# Patient Record
Sex: Female | Born: 1960 | Race: Black or African American | Hispanic: No | Marital: Single | Smoking: Former smoker
Health system: Southern US, Community
[De-identification: ages and names within clinical notes are randomized; demographics above are authoritative.]

## PROBLEM LIST (undated history)

## (undated) DIAGNOSIS — G5601 Carpal tunnel syndrome, right upper limb: Secondary | ICD-10-CM

## (undated) DIAGNOSIS — E785 Hyperlipidemia, unspecified: Secondary | ICD-10-CM

## (undated) DIAGNOSIS — J45909 Unspecified asthma, uncomplicated: Secondary | ICD-10-CM

## (undated) DIAGNOSIS — I10 Essential (primary) hypertension: Secondary | ICD-10-CM

## (undated) DIAGNOSIS — M5412 Radiculopathy, cervical region: Secondary | ICD-10-CM

## (undated) DIAGNOSIS — F329 Major depressive disorder, single episode, unspecified: Secondary | ICD-10-CM

## (undated) DIAGNOSIS — J432 Centrilobular emphysema: Secondary | ICD-10-CM

## (undated) DIAGNOSIS — R7303 Prediabetes: Secondary | ICD-10-CM

## (undated) DIAGNOSIS — J449 Chronic obstructive pulmonary disease, unspecified: Secondary | ICD-10-CM

## (undated) DIAGNOSIS — I700 Atherosclerosis of aorta: Secondary | ICD-10-CM

## (undated) DIAGNOSIS — R911 Solitary pulmonary nodule: Secondary | ICD-10-CM

## (undated) DIAGNOSIS — R635 Abnormal weight gain: Secondary | ICD-10-CM

## (undated) HISTORY — PX: CARPAL TUNNEL RELEASE: SHX101

## (undated) HISTORY — DX: Abnormal weight gain: R63.5

## (undated) HISTORY — DX: Carpal tunnel syndrome, right upper limb: G56.01

## (undated) HISTORY — DX: Radiculopathy, cervical region: M54.12

## (undated) HISTORY — DX: Essential (primary) hypertension: I10

## (undated) HISTORY — PX: TUBAL LIGATION: SHX77

## (undated) HISTORY — PX: OOPHORECTOMY: SHX86

## (undated) HISTORY — DX: Unspecified asthma, uncomplicated: J45.909

## (undated) HISTORY — PX: BREAST BIOPSY: SHX20

## (undated) HISTORY — DX: Hyperlipidemia, unspecified: E78.5

## (undated) HISTORY — DX: Prediabetes: R73.03

## (undated) HISTORY — PX: ABDOMINAL HYSTERECTOMY: SHX81

---
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
## 1997-08-23 HISTORY — PX: CERVICAL SPINE SURGERY: SHX589

## 2000-08-23 HISTORY — PX: CERVICAL FUSION: SHX112

## 2010-10-10 DIAGNOSIS — J449 Chronic obstructive pulmonary disease, unspecified: Secondary | ICD-10-CM

## 2010-10-10 HISTORY — DX: Chronic obstructive pulmonary disease, unspecified: J44.9

## 2011-05-03 DIAGNOSIS — F32A Depression, unspecified: Secondary | ICD-10-CM

## 2011-05-03 DIAGNOSIS — M545 Low back pain, unspecified: Secondary | ICD-10-CM | POA: Insufficient documentation

## 2011-05-03 DIAGNOSIS — E669 Obesity, unspecified: Secondary | ICD-10-CM | POA: Insufficient documentation

## 2011-05-03 DIAGNOSIS — F329 Major depressive disorder, single episode, unspecified: Secondary | ICD-10-CM | POA: Insufficient documentation

## 2011-05-03 DIAGNOSIS — E785 Hyperlipidemia, unspecified: Secondary | ICD-10-CM | POA: Insufficient documentation

## 2011-05-03 HISTORY — DX: Depression, unspecified: F32.A

## 2015-12-29 DIAGNOSIS — I83819 Varicose veins of unspecified lower extremities with pain: Secondary | ICD-10-CM | POA: Insufficient documentation

## 2018-06-02 DIAGNOSIS — Z23 Encounter for immunization: Secondary | ICD-10-CM | POA: Diagnosis not present

## 2019-01-03 ENCOUNTER — Other Ambulatory Visit: Payer: Self-pay

## 2019-01-03 ENCOUNTER — Encounter: Payer: Self-pay | Admitting: Sports Medicine

## 2019-01-03 ENCOUNTER — Ambulatory Visit (INDEPENDENT_AMBULATORY_CARE_PROVIDER_SITE_OTHER): Payer: Managed Care, Other (non HMO) | Admitting: Sports Medicine

## 2019-01-03 ENCOUNTER — Ambulatory Visit: Payer: Managed Care, Other (non HMO)

## 2019-01-03 DIAGNOSIS — M722 Plantar fascial fibromatosis: Secondary | ICD-10-CM

## 2019-01-03 DIAGNOSIS — I82812 Embolism and thrombosis of superficial veins of left lower extremities: Secondary | ICD-10-CM

## 2019-01-03 DIAGNOSIS — I872 Venous insufficiency (chronic) (peripheral): Secondary | ICD-10-CM

## 2019-01-03 DIAGNOSIS — D219 Benign neoplasm of connective and other soft tissue, unspecified: Secondary | ICD-10-CM | POA: Insufficient documentation

## 2019-01-03 DIAGNOSIS — I10 Essential (primary) hypertension: Secondary | ICD-10-CM | POA: Diagnosis not present

## 2019-01-03 MED ORDER — LISINOPRIL-HYDROCHLOROTHIAZIDE 20-25 MG PO TABS: 1.0000 | ORAL_TABLET | Freq: Every day | ORAL | 3 refills | Status: DC

## 2019-01-03 MED ORDER — MELOXICAM 15 MG PO TABS: ORAL_TABLET | ORAL | 3 refills | Status: DC

## 2019-01-03 NOTE — Assessment & Plan Note (Signed)
Needs to be consistent with bilateral lower extremity compression hose. When she returns we may add an Haematologist. 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

## 2019-01-03 NOTE — Assessment & Plan Note (Signed)
Referral for custom orthotics, avoid barefoot walking, meloxicam. Referral to physical therapy for rehab exercises. Return to see me in 1 month for this.

## 2019-01-03 NOTE — Progress Notes (Addendum)
Subjective:    CC: Multiple issues  HPI:  Pain: Localized on the plantar aspect of the heel, worse in the morning, worse when first getting up from seated.  She does tend to walk barefoot significantly and wear unsupportive shoes.  Pain is severe, persistent, localized without radiation.  Leg nodules: There is a tender nodule just below the knee on the left leg, she has varicose veins on both sides as well as a reddish ulcer on the right anterior shin.  Intermittent with wearing her lower extremity compression stockings.  Hypertension: Blood pressure is very elevated, over 200, no headache, vision changes, chest pain.  She does desire to establish care with 1 of our medical providers here in the office.  I reviewed the past medical history, family history, social history, surgical history, and allergies today and no changes were needed.  Please see the problem list section below in epic for further details.  Past Medical History: Past Medical History:  Diagnosis Date  . Asthma   . COPD (chronic obstructive pulmonary disease) ([place]) 10/10/2010  . Hypertension   . Weight gain    Past Surgical History: History reviewed. No pertinent surgical history. Social History: Social History   Socioeconomic History  . Marital status: Single    Spouse name: Not on file  . Number of children: 2  . Years of education: Not on file  . Highest education level: High school graduate  Occupational History  . Not on file  Social Needs  . Financial resource strain: Patient refused  . Food insecurity:    Worry: Not on file    Inability: Not on file  . Transportation needs:    Medical: Not on file    Non-medical: Not on file  Tobacco Use  . Smoking status: Former Smoker    Last attempt to quit: 01/02/2013    Years since quitting: 6.0  . Smokeless tobacco: Never Used  Substance and Sexual Activity  . Alcohol use: Never    Frequency: Never  . Drug use: Never  . Sexual activity: Not on file   Lifestyle  . Physical activity:    Days per week: Not on file    Minutes per session: Not on file  . Stress: Not on file  Relationships  . Social connections:    Talks on phone: Not on file    Gets together: Not on file    Attends religious service: Not on file    Active member of club or organization: Not on file    Attends meetings of clubs or organizations: Not on file    Relationship status: Not on file  Other Topics Concern  . Not on file  Social History Narrative  . Not on file   Family History: Family History  Problem Relation Age of Onset  . Cancer Neg Hx   . Birth defects Neg Hx   . Diabetes Neg Hx    Allergies: Allergies  Allergen Reactions  . Darvon [Propoxyphene] Other (See Comments)    Intolerance    Medications: See med rec.  Review of Systems: No headache, visual changes, nausea, vomiting, diarrhea, constipation, dizziness, abdominal pain, skin rash, fevers, chills, night sweats, swollen lymph nodes, weight loss, chest pain, body aches, joint swelling, muscle aches, shortness of breath, mood changes, visual or auditory hallucinations.  Objective:    General: Well Developed, well nourished, and in no acute distress.  Neuro: Alert and oriented x3, extra-ocular muscles intact, sensation grossly intact.  HEENT: Normocephalic, atraumatic, pupils  equal round reactive to light, neck supple, no masses, no lymphadenopathy, thyroid nonpalpable.  Skin: Warm and dry, no rashes noted.  Cardiac: Regular rate and rhythm, no murmurs rubs or gallops.  Respiratory: Clear to auscultation bilaterally. Not using accessory muscles, speaking in full sentences.  Abdominal: Soft, nontender, nondistended, positive bowel sounds, no masses, no organomegaly.  Right foot: No visible erythema or swelling. Range of motion is full in all directions. Strength is 5/5 in all directions. No hallux valgus. No pes cavus or pes planus. No abnormal callus noted. No pain over the navicular  prominence, or base of fifth metatarsal. Moderate tenderness to palpation of the calcaneal insertion of plantar fascia. No pain at the Achilles insertion. No pain over the calcaneal bursa. No pain of the retrocalcaneal bursa. No tenderness to palpation over the tarsals, metatarsals, or phalanges. No hallux rigidus or limitus. No tenderness palpation over interphalangeal joints. No pain with compression of the metatarsal heads. Neurovascularly intact distally. Right lower leg: Venous stasis ulcer over the shin, appears to be healing.  Moderate bilateral venous varicosities. Left lower leg: There is a palpable superficial venous thrombosis and what feels to be the great saphenous vein just distal to the knee.  The patient returned from ultrasound and I placed an Unna boot on her right lower extremity.  Impression and Recommendations:    The patient was counselled, risk factors were discussed, anticipatory guidance given.  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 She will massage this daily. She will wear bilateral compression hose every day all day.  Benign essential hypertension Blood pressure is severely elevated today. Starting lisinopril/HCTZ, adding labs, she will establish with Nelson Chimes, PA-C or Dr. Emeterio Reeve. No headaches, visual changes, chest pain.  Plantar fasciitis, right Referral for custom orthotics, avoid barefoot walking, meloxicam. Referral to physical therapy for rehab exercises. Return to see me in 1 month for this.  Venous stasis dermatitis of right lower extremity Needs to be consistent with bilateral lower extremity compression hose. When she returns we may add an Haematologist. 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   ___________________________________________ Gwen Her. Dianah Field, M.D., ABFM., CAQSM. Primary Care and Sports Medicine  [facility]  Adjunct  Professor of [place] of [facility] of Medicine

## 2019-01-03 NOTE — Assessment & Plan Note (Signed)
Meloxicam, ultrasound. She will massage this daily. She will wear bilateral compression hose every day all day.

## 2019-01-03 NOTE — Assessment & Plan Note (Signed)
Blood pressure is severely elevated today. Starting lisinopril/HCTZ, adding labs, she will establish with Nelson Chimes, PA-C or Dr. Emeterio Reeve. No headaches, visual changes, chest pain.

## 2019-01-04 LAB — COMPLETE METABOLIC PANEL WITH GFR
AG Ratio: 1.4 (calc) (ref 1.0–2.5)
ALT: 32 U/L — ABNORMAL HIGH (ref 6–29)
AST: 28 U/L (ref 10–35)
Albumin: 4.6 g/dL (ref 3.6–5.1)
Alkaline phosphatase (APISO): 96 U/L (ref 37–153)
BUN/Creatinine Ratio: 11 (calc) (ref 6–22)
BUN: 12 mg/dL (ref 7–25)
CO2: 30 mmol/L (ref 20–32)
Calcium: 9.8 mg/dL (ref 8.6–10.4)
Chloride: 102 mmol/L (ref 98–110)
Creat: 1.1 mg/dL — ABNORMAL HIGH (ref 0.50–1.05)
GFR, Est African American: 64 mL/min/{1.73_m2} (ref >=60)
GFR, Est Non African American: 55 mL/min/{1.73_m2} — ABNORMAL LOW (ref >=60)
Globulin: 3.2 g/dL (calc) (ref 1.9–3.7)
Glucose, Bld: 98 mg/dL (ref 65–99)
Potassium: 4 mmol/L (ref 3.5–5.3)
Sodium: 141 mmol/L (ref 135–146)
Total Bilirubin: 0.4 mg/dL (ref 0.2–1.2)
Total Protein: 7.8 g/dL (ref 6.1–8.1)

## 2019-01-04 LAB — LIPID PANEL W/REFLEX DIRECT LDL
Cholesterol: 289 mg/dL — ABNORMAL HIGH (ref <200)
HDL: 46 mg/dL — ABNORMAL LOW (ref >=50)
LDL Cholesterol (Calc): 197 mg/dL (calc) — ABNORMAL HIGH
Non-HDL Cholesterol (Calc): 243 mg/dL (calc) — ABNORMAL HIGH (ref <130)
Total CHOL/HDL Ratio: 6.3 (calc) — ABNORMAL HIGH (ref <5.0)
Triglycerides: 257 mg/dL — ABNORMAL HIGH (ref <150)

## 2019-01-04 LAB — CBC
HCT: 40.3 % (ref 35.0–45.0)
Hemoglobin: 13.4 g/dL (ref 11.7–15.5)
MCH: 29.8 pg (ref 27.0–33.0)
MCHC: 33.3 g/dL (ref 32.0–36.0)
MCV: 89.8 fL (ref 80.0–100.0)
MPV: 10.3 fL (ref 7.5–12.5)
Platelets: 292 10*3/uL (ref 140–400)
RBC: 4.49 10*6/uL (ref 3.80–5.10)
RDW: 13 % (ref 11.0–15.0)
WBC: 9.2 10*3/uL (ref 3.8–10.8)

## 2019-01-04 LAB — HEMOGLOBIN A1C
Hgb A1c MFr Bld: 6.2 % of total Hgb — ABNORMAL HIGH (ref <5.7)
Mean Plasma Glucose: 131 (calc)
eAG (mmol/L): 7.3 (calc)

## 2019-01-04 LAB — TSH: TSH: 2.88 mIU/L (ref 0.40–4.50)

## 2019-01-08 ENCOUNTER — Other Ambulatory Visit: Payer: Self-pay

## 2019-01-08 ENCOUNTER — Telehealth: Payer: Self-pay | Admitting: Sports Medicine

## 2019-01-08 ENCOUNTER — Encounter: Payer: Self-pay | Admitting: Rehabilitative and Restorative Service Providers"

## 2019-01-08 ENCOUNTER — Ambulatory Visit (INDEPENDENT_AMBULATORY_CARE_PROVIDER_SITE_OTHER): Payer: Managed Care, Other (non HMO) | Admitting: Sports Medicine

## 2019-01-08 ENCOUNTER — Encounter: Payer: Self-pay | Admitting: Sports Medicine

## 2019-01-08 ENCOUNTER — Ambulatory Visit (INDEPENDENT_AMBULATORY_CARE_PROVIDER_SITE_OTHER): Payer: Managed Care, Other (non HMO) | Admitting: Rehabilitative and Restorative Service Providers"

## 2019-01-08 DIAGNOSIS — I872 Venous insufficiency (chronic) (peripheral): Secondary | ICD-10-CM | POA: Diagnosis not present

## 2019-01-08 DIAGNOSIS — D367 Benign neoplasm of other specified sites: Secondary | ICD-10-CM | POA: Diagnosis not present

## 2019-01-08 DIAGNOSIS — R2689 Other abnormalities of gait and mobility: Secondary | ICD-10-CM

## 2019-01-08 DIAGNOSIS — I10 Essential (primary) hypertension: Secondary | ICD-10-CM

## 2019-01-08 DIAGNOSIS — M722 Plantar fascial fibromatosis: Secondary | ICD-10-CM | POA: Diagnosis not present

## 2019-01-08 DIAGNOSIS — D219 Benign neoplasm of connective and other soft tissue, unspecified: Secondary | ICD-10-CM

## 2019-01-08 DIAGNOSIS — M79671 Pain in right foot: Secondary | ICD-10-CM | POA: Diagnosis not present

## 2019-01-08 NOTE — Progress Notes (Signed)
   Procedure:  Excision of 1.1cm right leg subcutaneous tumor. Risks, benefits, and alternatives explained and consent obtained. Time out conducted. Surface prepped with alcohol. 5cc lidocaine with epinephine infiltrated in a field block. Adequate anesthesia ensured. Area prepped and draped in a sterile fashion. Excision performed with: Using a #10 blade I made a linear incision, I then used both sharp and blunt dissection to remove the lesion en bloc, it was a subcutaneous tumor approximately 1.1 cm across, well-defined, round, and very firm.  I then closed the incision with a running subcuticular 6-0 Vicryl suture. Hemostasis achieved. Pt stable.

## 2019-01-08 NOTE — Telephone Encounter (Signed)
I spoke with Annette Ayala at [facility] and she will fax the labs to our office so they can be reviewed. FYI

## 2019-01-08 NOTE — Assessment & Plan Note (Signed)
Repeat Unna boot, #2. Return in a week.

## 2019-01-08 NOTE — Assessment & Plan Note (Addendum)
Excision of 1.1 cm subcutaneous tumor. Primary closure, return in 2 weeks for wound check.  Path shows leiomyoma.

## 2019-01-08 NOTE — Progress Notes (Signed)
Subjective:    CC: Follow-up  HPI: Hypertension: Improved considerably, no headaches, visual changes, chest pain.  Plantar fasciitis: Improving slowly, does need to get the Unna boot off to do physical therapy eventually.  Venous stasis dermatitis: Improving with Unna boot on the right.  Subcutaneous lesion: Below the left knee, ultrasound showed a cystic structure, no evidence that this was vascular, she would like this to be removed, it is tender.  I reviewed the past medical history, family history, social history, surgical history, and allergies today and no changes were needed.  Please see the problem list section below in epic for further details.  Past Medical History: Past Medical History:  Diagnosis Date  . Asthma   . COPD (chronic obstructive pulmonary disease) ([place]) 10/10/2010  . Hypertension   . Weight gain    Past Surgical History: No past surgical history on file. Social History: Social History   Socioeconomic History  . Marital status: Single    Spouse name: Not on file  . Number of children: 2  . Years of education: Not on file  . Highest education level: High school graduate  Occupational History  . Not on file  Social Needs  . Financial resource strain: Patient refused  . Food insecurity:    Worry: Not on file    Inability: Not on file  . Transportation needs:    Medical: Not on file    Non-medical: Not on file  Tobacco Use  . Smoking status: Former Smoker    Last attempt to quit: 01/02/2013    Years since quitting: 6.0  . Smokeless tobacco: Never Used  Substance and Sexual Activity  . Alcohol use: Never    Frequency: Never  . Drug use: Never  . Sexual activity: Not on file  Lifestyle  . Physical activity:    Days per week: Not on file    Minutes per session: Not on file  . Stress: Not on file  Relationships  . Social connections:    Talks on phone: Not on file    Gets together: Not on file    Attends religious service: Not on file   Active member of club or organization: Not on file    Attends meetings of clubs or organizations: Not on file    Relationship status: Not on file  Other Topics Concern  . Not on file  Social History Narrative  . Not on file   Family History: Family History  Problem Relation Age of Onset  . Cancer Neg Hx   . Birth defects Neg Hx   . Diabetes Neg Hx    Allergies: Allergies  Allergen Reactions  . Darvon [Propoxyphene] Other (See Comments)    Intolerance    Medications: See med rec.  Review of Systems: No fevers, chills, night sweats, weight loss, chest pain, or shortness of breath.   Objective:    General: Well Developed, well nourished, and in no acute distress.  Neuro: Alert and oriented x3, extra-ocular muscles intact, sensation grossly intact.  HEENT: Normocephalic, atraumatic, pupils equal round reactive to light, neck supple, no masses, no lymphadenopathy, thyroid nonpalpable.  Skin: Warm and dry, no rashes. Cardiac: Regular rate and rhythm, no murmurs rubs or gallops, no lower extremity edema.  Respiratory: Clear to auscultation bilaterally. Not using accessory muscles, speaking in full sentences.  Unna boot placed on the right lower extremity.  Impression and Recommendations:    Venous stasis dermatitis of right lower extremity Repeat Unna boot, #2. Return in a  week.  Cyst, dermoid, leg, left Return at 3:00 today for surgical excision, ultrasound showed a cystic structure, subcutaneous, and did not indicate this was vascular.  Benign essential hypertension Improved considerably, 438 systolic down to 377 systolic today. Still needs to establish care with 1 of our medical providers.   ___________________________________________ Gwen Her. Dianah Field, M.D., ABFM., CAQSM. Primary Care and Sports Medicine [place] [facility]  Adjunct Professor of [place] of [facility] of Medicine

## 2019-01-08 NOTE — Assessment & Plan Note (Signed)
Return at 3:00 today for surgical excision, ultrasound showed a cystic structure, subcutaneous, and did not indicate this was vascular.

## 2019-01-08 NOTE — Addendum Note (Signed)
Addended by: Jamesetta So on: 01/08/2019 04:51 PM   Modules accepted: Orders

## 2019-01-08 NOTE — Assessment & Plan Note (Addendum)
Improved considerably, 831 systolic down to 517 systolic today. Still needs to establish care with 1 of our medical providers.

## 2019-01-08 NOTE — Therapy (Addendum)
[facility] [place], [place], [address] Phone: [phone]   Fax:  [phone]  Physical Therapy Evaluation  Patient Details  Name: Annette Ayala MRN: [id number] Date of Birth: 06/04/1961 Referring Provider (PT): Dr Dianah Field    Encounter Date: 01/08/2019  PT End of Session - 01/08/19 0934    Visit Number  1    Number of Visits  12    Date for PT Re-Evaluation  02/19/19    PT Start Time  0900    PT Stop Time  0940    PT Time Calculation (min)  40 min    Activity Tolerance  Patient tolerated treatment well       Past Medical History:  Diagnosis Date  . Asthma   . COPD (chronic obstructive pulmonary disease) ([place]) 10/10/2010  . Hypertension   . Weight gain     History reviewed. No pertinent surgical history.  There were no vitals filed for this visit.   Subjective Assessment - 01/08/19 0902    Subjective  Patient reports pain in the Rt foot for the past year with no known injury. Symptoms have increased in the past few weeks. She has a venous varicosities being treated with medication and ace wrap weekly.     Pertinent History  HTN; COPD     Patient Stated Goals  Patient wants to get rid of foot pain     Currently in Pain?  Yes    Pain Score  2     Pain Location  Heel    Pain Orientation  Right    Pain Descriptors / Indicators  Burning    Pain Radiating Towards  back of the heel     Pain Onset  More than a month ago    Pain Frequency  Constant    Aggravating Factors   standing; walking on concrete; standing up after being sitting     Pain Relieving Factors  wrap from MD; rolling ball on bottom of foot          [facility] PT Assessment - 01/08/19 0001      Assessment   Medical Diagnosis  Rt plantar fasciitis     Referring Provider (PT)  Dr Dianah Field     Onset Date/Surgical Date  12/21/17   worse in the past 6 months    Hand Dominance  Right    Next MD Visit  today     Prior  Therapy  none       Precautions   Precautions  None      Restrictions   Weight Bearing Restrictions  No      Balance Screen   Has the patient fallen in the past 6 months  No    Has the patient had a decrease in activity level because of a fear of falling?   No    Is the patient reluctant to leave their home because of a fear of falling?   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     Leisure  household chores; cooking; relaxing       Observation/Other Assessments  Focus on Therapeutic Outcomes (FOTO)   45% limitation       Sensation   Additional Comments  WFL's per pt report       Posture/Postural Control   Posture Comments  head forward; hips flexed forward; wt shifted to Lt       AROM   Right/Left Hip  --   end range tightness hip ext and rotation    Right/Left Knee  --   WFL's bilat    Right/Left Ankle  --   WFL's bilat ankles except as noted Rt ankle DF    Right Ankle Dorsiflexion  10    Left Ankle Dorsiflexion  15      Strength   Overall Strength Comments  WFL's bilat hips; knees     Right Ankle Dorsiflexion  5/5    Right Ankle Plantar Flexion  3/5    Left Ankle Dorsiflexion  5/5    Left Ankle Plantar Flexion  3+/5      Flexibility   Hamstrings  tight Rt > Lt    Quadriceps  tight Rt > Lt    ITB  tight Rt > Lt    Piriformis  tight Rt > Lt       Palpation   Palpation comment  tenderness and tightness to palpation through the Rt plantar surface with TP close to calcaneous       Ambulation/Gait   Pre-Gait Activities  SLS Rt 4 sec; Lt 2 sec     Gait Comments  limpt Rt LE with wt bearing Rt                 Objective measurements completed on examination: See above findings.       [place] Adult PT Treatment/Exercise - 01/08/19 0001      Self-Care   Self-Care  Other Self-Care Comments    Other Self-Care Comments   Pt educated on heat/ice parameters with application to plantar fascia; pt educated on self massage with ball/ frozen water bottle - pt verbalized understanding.       Ankle Exercises: Stretches   Soleus Stretch  2 reps;20 seconds   each leg   Gastroc Stretch  2 reps;20 seconds   each leg   Other Stretch  supine Rt hamstring stretch with strap x 30 sec x 3 reps;  seated R/L hamstring stretch x 30 sec x 2 reps each             PT Education - 01/08/19 0938    Education Details  HEP; POC    Person(s) Educated  Patient    Methods  Explanation;Handout;Verbal cues;Demonstration    Comprehension  Verbalized understanding;Returned demonstration          PT Long Term Goals - 01/08/19 0938      PT LONG TERM GOAL #1   Title  Decrease pain Rt foot by 50-75% allowing patient to perform normal functional activities with minimal pain or limitations 02/19/2019    Time  6    Period  Weeks    Status  New      PT LONG TERM GOAL #2   Title  Increase AROM Rt ankle DF to equal LT 02/19/2019    Time  6    Period  Weeks    Status  New      PT LONG TERM GOAL #3   Title  Patient to demonstrate improve SLS to 10 sec on each LE with minimal to no UE support to improve functional strength  and balance 02/19/2019    Time  6    Period  Weeks    Status  New      PT LONG TERM GOAL #4   Title  Independent in HEP 02/19/2019    Time  6    Period  Weeks    Status  New      PT LONG TERM GOAL #5   Title  Improve FOTO to </= 35% limitation 02/19/2019    Time  6    Period  Weeks    Status  New             Plan - 01/08/19 0935    Clinical Impression Statement  Patient presents with signs and symptoms consistent with Rt plantar fasciitis which is chronic in nature. Symptoms have been increasing in the past 6 months. Patient has limited LE mobility and  pain with palpation along the plantar surface. She will benefit form PT to address problems identified.     Stability/Clinical Decision Making  Stable/Uncomplicated    Clinical Decision Making  Low    Rehab Potential  Good    PT Frequency  2x / week    PT Duration  6 weeks    PT Treatment/Interventions  Patient/family education;ADLs/Self Care Home Management;Cryotherapy;Electrical Stimulation;Iontophoresis 4mg /ml Dexamethasone;Moist Heat;Ultrasound;Therapeutic activities;Therapeutic exercise;Neuromuscular re-education;Functional mobility training;Taping;Passive range of motion;Dry needling;Manual techniques    PT Next Visit Plan  review HEP; progress with balance activities; manual work through plantar surface; taping as possible based on treatment for venous varicosities; modalities as indicated     PT Home Exercise Plan  Access Code: [email or web address]    Recommended Other Services  Response from Dr Dianah Field re-ace wrap Rt LE - patient will take current ace wrap treatment off Monday and that will be the last one. She is free to do what is needed in PT at that point.     Consulted and Agree with Plan of Care  Patient       Patient will benefit from skilled therapeutic intervention in order to improve the following deficits and impairments:  Postural dysfunction, Improper body mechanics, Pain, Increased fascial restricitons, Increased muscle spasms, Hypomobility, Decreased range of motion, Decreased mobility, Decreased activity tolerance, Abnormal gait  Visit Diagnosis: Plantar fasciitis of right foot - Plan: PT plan of care cert/re-cert  Pain in right foot - Plan: PT plan of care cert/re-cert  Other abnormalities of gait and mobility - Plan: PT plan of care cert/re-cert     Problem List Patient Active Problem List   Diagnosis Date Noted  . Benign essential hypertension 01/03/2019  . Plantar fasciitis, right 01/03/2019  . Venous stasis dermatitis of right lower extremity 01/03/2019  . Cyst,  dermoid, leg, left 01/03/2019    Adom Schoeneck Nilda Simmer PT, MPH  01/08/2019, 11:10 AM  [facility] [place], [place], [address] Phone: [phone]   Fax:  [phone]  Name: RENLEE FLOOR MRN: [id number] Date of Birth: 1961/07/20

## 2019-01-08 NOTE — Patient Instructions (Signed)
Access Code: [email or web address]  URL: [email or web address]  Date: 01/08/2019  Prepared by: Kerin Perna   Exercises  Soleus Stretch on Wall - 2-3 reps - 1 sets - 20 hold - 2x daily - 7x weekly  Gastroc Stretch on Wall - 2-3 reps - 1 sets - 20 hold - 2x daily - 7x weekly  Seated Hamstring Stretch - 2-3 reps - 1 sets - 30 hold - 2x daily - 7x weekly  Hooklying Hamstring Stretch with Strap - 2-3 reps - 1 sets - 30 hold - 2x daily - 7x weekly  Foot Roller Plantar Massage - 1x daily - 7x weekly

## 2019-01-12 ENCOUNTER — Encounter: Payer: Self-pay | Admitting: Physical Therapy

## 2019-01-12 ENCOUNTER — Ambulatory Visit (INDEPENDENT_AMBULATORY_CARE_PROVIDER_SITE_OTHER): Payer: Managed Care, Other (non HMO) | Admitting: Physical Therapy

## 2019-01-12 ENCOUNTER — Other Ambulatory Visit: Payer: Self-pay

## 2019-01-12 DIAGNOSIS — R2689 Other abnormalities of gait and mobility: Secondary | ICD-10-CM

## 2019-01-12 DIAGNOSIS — M79671 Pain in right foot: Secondary | ICD-10-CM

## 2019-01-12 DIAGNOSIS — M722 Plantar fascial fibromatosis: Secondary | ICD-10-CM

## 2019-01-12 NOTE — Therapy (Signed)
[place], [place], [address] Phone: [phone]   Fax:  [phone]  Physical Therapy Treatment  Patient Details  Name: Annette Ayala MRN: [id number] Date of Birth: 1961-04-22 Referring Provider (PT): Dr Dianah Field    Encounter Date: 01/12/2019  PT End of Session - 01/12/19 1107    Visit Number  2    Number of Visits  12    Date for PT Re-Evaluation  02/19/19    PT Start Time  1102    PT Stop Time  1152    PT Time Calculation (min)  50 min    Activity Tolerance  Patient tolerated treatment well;No increased pain    Behavior During Therapy  WFL for tasks assessed/performed       Past Medical History:  Diagnosis Date  . Asthma   . COPD (chronic obstructive pulmonary disease) ([place]) 10/10/2010  . Hypertension   . Weight gain     History reviewed. No pertinent surgical history.  There were no vitals filed for this visit.  Subjective Assessment - 01/12/19 1109    Subjective  Pt reports she has to keep her ace wrap on until next monday. She worked 7.5 hrs yesterday, so her heel hurts a little worse today.  She is supposed to get injection in heel on 6/1.     Patient Stated Goals  Patient wants to get rid of foot pain     Currently in Pain?  Yes    Pain Score  4     Pain Location  Heel    Pain Orientation  Right    Pain Descriptors / Indicators  Tightness    Aggravating Factors   standing, walking on concrete    Pain Relieving Factors  rolling ball on bottom of foot, rest, medicine.          [facility] PT Assessment - 01/12/19 0001      Assessment   Medical Diagnosis  Rt plantar fasciitis     Referring Provider (PT)  Dr Dianah Field     Onset Date/Surgical Date  12/21/17   worse in the past 6 months    Hand Dominance  Right    Next MD Visit  01/22/19    Prior Therapy  none       [facility] Adult PT Treatment/Exercise - 01/12/19 0001      Self-Care   Self-Care  Heat/Ice Application     Heat/Ice Application  reviewed ice application parameters; encouraged pt to ice at least 1x/day, 10-15 min.       Modalities   Modalities  Cryotherapy      Cryotherapy   Number Minutes Cryotherapy  10 Minutes    Cryotherapy Location  --   Rt plantar surface of foot   Type of Cryotherapy  Ice pack   pt sitting in chair     Manual Therapy   Manual Therapy  Soft tissue mobilization    Manual therapy comments  pt prone; ace wrap still in place from toes to calf on RLE    Soft tissue mobilization  gentle STM to Rt plantar fascia      Ankle Exercises: Aerobic   Nustep  NuStep (legs only) L3, 28mn      Ankle Exercises: Stretches   Plantar Fascia Stretch  2 reps;30 seconds   RLE   Soleus Stretch  2 reps;20 seconds   each leg   Gastroc Stretch  2 reps;20 seconds   each leg  Other Stretch  seated hamstring stretch x 20 sec x 2 reps each leg; seated piriformis stretch x 20 sec x 2 reps each leg       Ankle Exercises: Seated   ABC's  1 rep    Ankle Circles/Pumps  AROM;Right;10 reps    Other Seated Ankle Exercises  resisted Rt DF with red band, 10 reps x 2 sets             PT Education - 01/12/19 1132    Education Details  HEP updated, encouraged pt to ice daily, and do ankle circles/ABCs prior to standing to loosen plantar fascia (ie: 1st thing in AM)    Person(s) Educated  Patient    Methods  Explanation;Demonstration;Handout    Comprehension  Verbalized understanding;Returned demonstration          PT Long Term Goals - 01/08/19 0938      PT LONG TERM GOAL #1   Title  Decrease pain Rt foot by 50-75% allowing patient to perform normal functional activities with minimal pain or limitations 02/19/2019    Time  6    Period  Weeks    Status  New      PT LONG TERM GOAL #2   Title  Increase AROM Rt ankle DF to equal LT 02/19/2019    Time  6    Period  Weeks    Status  New      PT LONG TERM GOAL #3   Title  Patient to demonstrate improve SLS to 10 sec on each LE  with minimal to no UE support to improve functional strength and balance 02/19/2019    Time  6    Period  Weeks    Status  New      PT LONG TERM GOAL #4   Title  Independent in HEP 02/19/2019    Time  6    Period  Weeks    Status  New      PT LONG TERM GOAL #5   Title  Improve FOTO to </= 35% limitation 02/19/2019    Time  6    Period  Weeks    Status  New            Plan - 01/12/19 1200    Clinical Impression Statement  Pt has had positive response to calf stretches so far.  she tolerated new exercises well and reported reduction of pain at conclusion of session.  No new goals met; only 2nd visit    Rehab Potential  Good    PT Frequency  2x / week    PT Duration  6 weeks    PT Treatment/Interventions  Patient/family education;ADLs/Self Care Home Management;Cryotherapy;Electrical Stimulation;Iontophoresis 74m/ml Dexamethasone;Moist Heat;Ultrasound;Therapeutic activities;Therapeutic exercise;Neuromuscular re-education;Functional mobility training;Taping;Passive range of motion;Dry needling;Manual techniques    PT Next Visit Plan  manual therapy to Rt foot and initiate ionto trial.  progress exercises as tolerated.     PT Home Exercise Plan  Access Code: [email or web address]   Consulted and Agree with Plan of Care  Patient       Patient will benefit from skilled therapeutic intervention in order to improve the following deficits and impairments:  Postural dysfunction, Improper body mechanics, Pain, Increased fascial restricitons, Increased muscle spasms, Hypomobility, Decreased range of motion, Decreased mobility, Decreased activity tolerance, Abnormal gait  Visit Diagnosis: Plantar fasciitis of right foot  Pain in right foot  Other abnormalities of gait and mobility     Problem List Patient Active Problem  List   Diagnosis Date Noted  . Benign essential hypertension 01/03/2019  . Plantar fasciitis, right 01/03/2019  . Venous stasis dermatitis of right lower extremity 01/03/2019   . Leiomyoma of leg 01/03/2019   Kerin Perna, PTA 01/12/19 12:13 PM  [facility] [place], [place], [address] Phone: [phone]   Fax:  [phone]  Name: Annette Ayala MRN: [id number] Date of Birth: 06-29-1961

## 2019-01-12 NOTE — Patient Instructions (Addendum)
Soleus / Plantar Fascia With Toe Flexion, Standing    Stand, toes of one foot bent upward against vertical surface. Place other leg behind and use knee to bend front knee. Hold _20__ seconds. Repeat _2__ times per session. Do __2_ sessions per day.  Access Code: [email or web address]  URL: [email or web address]  Date: 01/12/2019  Prepared by: Kerin Perna   Exercises  Soleus Stretch on Wall - 2-3 reps - 1 sets - 20 hold - 2x daily - 7x weekly  Gastroc Stretch on Wall - 2-3 reps - 1 sets - 20 hold - 2x daily - 7x weekly  Seated Hamstring Stretch - 2-3 reps - 1 sets - 30 hold - 2x daily - 7x weekly  Foot Roller Plantar Massage - 1x daily - 7x weekly  Seated Piriformis Stretch - 2-3 reps - 1 sets - 30 hold - 1x daily - 7x weekly  Seated Ankle Alphabet - 10 reps - 1 sets - 2x daily - 7x weekly  Seated Heel Toe Raises - 10 reps - 1 sets - 2x daily - 7x weekly

## 2019-01-17 ENCOUNTER — Encounter: Payer: Managed Care, Other (non HMO) | Admitting: Physical Therapy

## 2019-01-19 ENCOUNTER — Ambulatory Visit (INDEPENDENT_AMBULATORY_CARE_PROVIDER_SITE_OTHER): Payer: Managed Care, Other (non HMO) | Admitting: Rehabilitative and Restorative Service Providers"

## 2019-01-19 ENCOUNTER — Encounter: Payer: Self-pay | Admitting: Rehabilitative and Restorative Service Providers"

## 2019-01-19 ENCOUNTER — Other Ambulatory Visit: Payer: Self-pay

## 2019-01-19 DIAGNOSIS — M79671 Pain in right foot: Secondary | ICD-10-CM | POA: Diagnosis not present

## 2019-01-19 DIAGNOSIS — R2689 Other abnormalities of gait and mobility: Secondary | ICD-10-CM | POA: Diagnosis not present

## 2019-01-19 DIAGNOSIS — M722 Plantar fascial fibromatosis: Secondary | ICD-10-CM

## 2019-01-19 NOTE — Therapy (Signed)
[place], [place], [address] Phone: [phone]   Fax:  [phone]  Physical Therapy Treatment  Patient Details  Name: Annette Ayala MRN: [id number] Date of Birth: [birth year] Referring Provider (PT): Dr Dianah Field    Encounter Date: 01/19/2019  PT End of Session - 01/19/19 1058    Visit Number  3    Number of Visits  12    Date for PT Re-Evaluation  02/19/19    PT Start Time  1100    PT Stop Time  1154    PT Time Calculation (min)  54 min    Activity Tolerance  Patient tolerated treatment well       Past Medical History:  Diagnosis Date  . Asthma   . COPD (chronic obstructive pulmonary disease) ([place]) 10/10/2010  . Hypertension   . Weight gain     History reviewed. No pertinent surgical history.  There were no vitals filed for this visit.  Subjective Assessment - 01/19/19 1059    Subjective  Ace wrap was too tight and has bruised her foot. She took it off early because it was so tight. Foot is getting better. Has been icing her foot after work and doing her exercises. Patient is scheduled for an injectioin in heel Monday 01/22/2019.     Currently in Pain?  No/denies         [facility] PT Assessment - 01/19/19 0001      Assessment   Medical Diagnosis  Rt plantar fasciitis     Referring Provider (PT)  Dr Dianah Field     Onset Date/Surgical Date  12/21/17   worse in the past 6 months    Hand Dominance  Right    Next MD Visit  01/22/19    Prior Therapy  none       Flexibility   Hamstrings  tight Rt > Lt    Quadriceps  tight Rt > Lt    ITB  tight Rt > Lt    Piriformis  tight Rt > Lt       Palpation   Palpation comment  tenderness and tightness to palpation through the Rt plantar surface with TP close to calcaneous       Ambulation/Gait   Gait Comments  improving gait pattern with decreased limp Rt LE w/ wt bearing on Rt                    [facility] Adult PT  Treatment/Exercise - 01/19/19 0001      Knee/Hip Exercises: Stretches   Passive Hamstring Stretch  Right;2 reps;30 seconds   supine with strap    Quad Stretch  Right;Left;2 reps;30 seconds   prone w/ strap - PT provided direct pressure Rt d/t cramping   ITB Stretch  Right;2 reps;30 seconds   supine with strap    Piriformis Stretch  Right;2 reps;30 seconds   supine travell - Rt foot resting on Lt thigh      Knee/Hip Exercises: Sidelying   Hip ABduction  AROM;Strengthening;Right;Left;2 sets;10 reps   hips forward; leading with heel      Cryotherapy   Number Minutes Cryotherapy  10 Minutes    Cryotherapy Location  --   Rt plantar surface of foot   Type of Cryotherapy  Ice pack   pt sitting foot on ice      Iontophoresis   Type of Iontophoresis  Dexamethasone    Location  plantar surface at calcaneous Rt foot  Dose  1 cc/ 80 mAmp/min     Time  6-8 hours       Manual Therapy   Manual Therapy  Soft tissue mobilization    Manual therapy comments  pt prone; ace wrap still in place from toes to calf on RLE    Soft tissue mobilization  IASTM plantar surface or Rt foot       Ankle Exercises: Aerobic   Nustep  NuStep (legs only) L5, 37min      Ankle Exercises: Stretches   Plantar Fascia Stretch  2 reps;30 seconds   RLE   Soleus Stretch  2 reps;20 seconds   each leg   Gastroc Stretch  2 reps;20 seconds   each leg   Slant Board Stretch  3 reps;30 seconds             PT Education - 01/19/19 1146    Education Details  HEP ionto    Person(s) Educated  Patient    Methods  Explanation;Demonstration;Tactile cues;Verbal cues;Handout    Comprehension  Verbalized understanding;Returned demonstration;Verbal cues required;Tactile cues required          PT Long Term Goals - 01/08/19 0938      PT LONG TERM GOAL #1   Title  Decrease pain Rt foot by 50-75% allowing patient to perform normal functional activities with minimal pain or limitations 02/19/2019    Time  6     Period  Weeks    Status  New      PT LONG TERM GOAL #2   Title  Increase AROM Rt ankle DF to equal LT 02/19/2019    Time  6    Period  Weeks    Status  New      PT LONG TERM GOAL #3   Title  Patient to demonstrate improve SLS to 10 sec on each LE with minimal to no UE support to improve functional strength and balance 02/19/2019    Time  6    Period  Weeks    Status  New      PT LONG TERM GOAL #4   Title  Independent in HEP 02/19/2019    Time  6    Period  Weeks    Status  New      PT LONG TERM GOAL #5   Title  Improve FOTO to </= 35% limitation 02/19/2019    Time  6    Period  Weeks    Status  New            Plan - 01/19/19 1058    Clinical Impression Statement  Good improvement with treatment and HEP. Patient reports decreased pain in the Rt foot/heel. She is working on exercises at home and using ice after work. Progressing well toward stated goals. Will benefit from continued PT to further decrease symptoms.     Rehab Potential  Good    PT Frequency  2x / week    PT Duration  6 weeks    PT Treatment/Interventions  Patient/family education;ADLs/Self Care Home Management;Cryotherapy;Electrical Stimulation;Iontophoresis 4mg /ml Dexamethasone;Moist Heat;Ultrasound;Therapeutic activities;Therapeutic exercise;Neuromuscular re-education;Functional mobility training;Taping;Passive range of motion;Dry needling;Manual techniques    PT Next Visit Plan  manual therapy to Rt foot and initiate ionto trial.  progress exercises as tolerated; assess response to trial of ionto plantar fascia Rt foot     PT Home Exercise Plan  Access Code: [email or web address]    Consulted and Agree with Plan of Care  Patient  Patient will benefit from skilled therapeutic intervention in order to improve the following deficits and impairments:  Postural dysfunction, Improper body mechanics, Pain, Increased fascial restricitons, Increased muscle spasms, Hypomobility, Decreased range of motion, Decreased mobility,  Decreased activity tolerance, Abnormal gait  Visit Diagnosis: Plantar fasciitis of right foot  Pain in right foot  Other abnormalities of gait and mobility     Problem List Patient Active Problem List   Diagnosis Date Noted  . Benign essential hypertension 01/03/2019  . Plantar fasciitis, right 01/03/2019  . Venous stasis dermatitis of right lower extremity 01/03/2019  . Leiomyoma of leg 01/03/2019    Annette Ayala Annette Ayala PT, MPH  01/19/2019, 12:04 PM  [facility] [place], [place], [address] Phone: [phone]   Fax:  [phone]  Name: Annette Ayala MRN: [id number] Date of Birth: [birth year]

## 2019-01-19 NOTE — Patient Instructions (Addendum)
Access Code: [email or web address]  URL: [email or web address]  Date: 01/19/2019  Prepared by: Gillermo Murdoch   Exercises  Soleus Stretch on Wall - 2-3 reps - 1 sets - 20 hold - 2x daily - 7x weekly  Gastroc Stretch on Wall - 2-3 reps - 1 sets - 20 hold - 2x daily - 7x weekly  Seated Hamstring Stretch - 2-3 reps - 1 sets - 30 hold - 2x daily - 7x weekly  Foot Roller Plantar Massage - 1x daily - 7x weekly  Seated Piriformis Stretch - 2-3 reps - 1 sets - 30 hold - 1x daily - 7x weekly  Seated Ankle Alphabet - 10 reps - 1 sets - 2x daily - 7x weekly  Seated Heel Toe Raises - 10 reps - 1 sets - 2x daily - 7x weekly  Sidelying Hip Abduction - 10 reps - 2-3 sets - 2-3 sec hold - 2x daily - 7x weekly  Prone Quadriceps Stretch with Strap - 3 reps - 1 sets - 30 sec hold - 2x daily - 7x weekly  Supine Piriformis Stretch with Leg Straight - 3 reps - 1 sets - 30 sec hold - 2x daily - 7x weekly  Hooklying Hamstring Stretch with Strap - 3 reps - 1 sets - 30 sec hold - 2x daily - 7x weekly  Supine ITB Stretch with Strap - 3 reps - 1 sets - 30 sec hold - 2x daily - 7x weekly    IONTOPHORESIS PATIENT PRECAUTIONS & CONTRAINDICATIONS:  . Redness under one or both electrodes can occur.  This characterized by a uniform redness that usually disappears within 12 hours of treatment. . Small pinhead size blisters may result in response to the drug.  Contact your physician if the problem persists more than 24 hours. . On rare occasions, iontophoresis therapy can result in temporary skin reactions such as rash, inflammation, irritation or burns.  The skin reactions may be the result of individual sensitivity to the ionic solution used, the condition of the skin at the start of treatment, reaction to the materials in the electrodes, allergies or sensitivity to dexamethasone, or a poor connection between the patch and your skin.  Discontinue using iontophoresis if you have any of these reactions and report to your  therapist. . Remove the Patch or electrodes if you have any undue sensation of pain or burning during the treatment and report discomfort to your therapist. . Tell your Therapist if you have had known adverse reactions to the application of electrical current. . If using the Patch, the LED light will turn off when treatment is complete and the patch can be removed.  Approximate treatment time is 1-3 hours.  Remove the patch when light goes off or after 6 hours. . The Patch can be worn during normal activity, however excessive motion where the electrodes have been placed can cause poor contact between the skin and the electrode or uneven electrical current resulting in greater risk of skin irritation. Marland Kitchen Keep out of the reach of children.   . DO NOT use if you have a cardiac pacemaker or any other electrically sensitive implanted device. . DO NOT use if you have a known sensitivity to dexamethasone. . DO NOT use during Magnetic Resonance Imaging (MRI). . DO NOT use over broken or compromised skin (e.g. sunburn, cuts, or acne) due to the increased risk of skin reaction. . DO NOT SHAVE over the area to be treated:  To establish good contact between the Patch and the  skin, excessive hair may be clipped. . DO NOT place the Patch or electrodes on or over your eyes, directly over your heart, or brain. . DO NOT reuse the Patch or electrodes as this may cause burns to occur.

## 2019-01-22 ENCOUNTER — Other Ambulatory Visit: Payer: Self-pay

## 2019-01-22 ENCOUNTER — Ambulatory Visit (INDEPENDENT_AMBULATORY_CARE_PROVIDER_SITE_OTHER): Payer: Managed Care, Other (non HMO) | Admitting: Rehabilitative and Restorative Service Providers"

## 2019-01-22 ENCOUNTER — Encounter: Payer: Self-pay | Admitting: Sports Medicine

## 2019-01-22 ENCOUNTER — Encounter: Payer: Self-pay | Admitting: Rehabilitative and Restorative Service Providers"

## 2019-01-22 ENCOUNTER — Ambulatory Visit (INDEPENDENT_AMBULATORY_CARE_PROVIDER_SITE_OTHER): Payer: Managed Care, Other (non HMO) | Admitting: Sports Medicine

## 2019-01-22 DIAGNOSIS — M722 Plantar fascial fibromatosis: Secondary | ICD-10-CM | POA: Diagnosis not present

## 2019-01-22 DIAGNOSIS — R2689 Other abnormalities of gait and mobility: Secondary | ICD-10-CM | POA: Diagnosis not present

## 2019-01-22 DIAGNOSIS — M79671 Pain in right foot: Secondary | ICD-10-CM | POA: Diagnosis not present

## 2019-01-22 DIAGNOSIS — L989 Disorder of the skin and subcutaneous tissue, unspecified: Secondary | ICD-10-CM | POA: Diagnosis not present

## 2019-01-22 DIAGNOSIS — D219 Benign neoplasm of connective and other soft tissue, unspecified: Secondary | ICD-10-CM

## 2019-01-22 NOTE — Progress Notes (Signed)
Subjective:    CC: Follow-up  HPI: Raguel is a pleasant 58 year old female, we removed a mass from her left medial knee at the last visit, pathology showed a leiomyoma.  She has noted other lesions on her skin, raised, on both arms, as well as her right knee.  Symptoms are moderate, persistent.  I reviewed the past medical history, family history, social history, surgical history, and allergies today and no changes were needed.  Please see the problem list section below in epic for further details.  Past Medical History: Past Medical History:  Diagnosis Date  . Asthma   . COPD (chronic obstructive pulmonary disease) ([place]) 10/10/2010  . Hypertension   . Weight gain    Past Surgical History: No past surgical history on file. Social History: Social History   Socioeconomic History  . Marital status: Single    Spouse name: Not on file  . Number of children: 2  . Years of education: Not on file  . Highest education level: High school graduate  Occupational History  . Not on file  Social Needs  . Financial resource strain: Patient refused  . Food insecurity:    Worry: Not on file    Inability: Not on file  . Transportation needs:    Medical: Not on file    Non-medical: Not on file  Tobacco Use  . Smoking status: Former Smoker    Last attempt to quit: 01/02/2013    Years since quitting: 6.0  . Smokeless tobacco: Never Used  Substance and Sexual Activity  . Alcohol use: Never    Frequency: Never  . Drug use: Never  . Sexual activity: Not on file  Lifestyle  . Physical activity:    Days per week: Not on file    Minutes per session: Not on file  . Stress: Not on file  Relationships  . Social connections:    Talks on phone: Not on file    Gets together: Not on file    Attends religious service: Not on file    Active member of club or organization: Not on file    Attends meetings of clubs or organizations: Not on file    Relationship status: Not on file  Other Topics  Concern  . Not on file  Social History Narrative  . Not on file   Family History: Family History  Problem Relation Age of Onset  . Cancer Neg Hx   . Birth defects Neg Hx   . Diabetes Neg Hx    Allergies: Allergies  Allergen Reactions  . Darvon [Propoxyphene] Other (See Comments)    Intolerance    Medications: See med rec.  Review of Systems: No fevers, chills, night sweats, weight loss, chest pain, or shortness of breath.   Objective:    General: Well Developed, well nourished, and in no acute distress.  Neuro: Alert and oriented x3, extra-ocular muscles intact, sensation grossly intact.  HEENT: Normocephalic, atraumatic, pupils equal round reactive to light, neck supple, no masses, no lymphadenopathy, thyroid nonpalpable.  Skin: Warm and dry, no rashes.  There is a hyperpigmented, 1.5 cm lesion, raised on the right knee, she has similar lesions on her left and right elbows.  Incision is clean, dry, intact, there is a bit of scaliness, a touch of erythema but otherwise looks okay. Cardiac: Regular rate and rhythm, no murmurs rubs or gallops, no lower extremity edema.  Respiratory: Clear to auscultation bilaterally. Not using accessory muscles, speaking in full sentences.  Procedure: Shave biopsy  of right knee skin lesion Risks, benefits, and alternatives explained and consent obtained. Time out conducted. Surface prepped with alcohol. 3cc lidocaine with epinephine infiltrated in a field block. Adequate anesthesia ensured. Area prepped and draped in a sterile fashion. Excision performed with: Using a derma blade I excised the lesion into the deep dermis, I then used a Hyfrecator to achieve hemostasis. Pt stable.  Impression and Recommendations:    Skin lesion Suspicious skin lesion on the right medial knee, shave biopsy with hyfrecation. Return in 2 weeks for wound check.  Leiomyoma of leg Path shows a benign leiomyoma, incision is healing well.     ___________________________________________ Gwen Her. Dianah Field, M.D., ABFM., CAQSM. Primary Care and Sports Medicine [facility]  Adjunct Professor of [place] [employer] [facility] [facility] [employer]

## 2019-01-22 NOTE — Therapy (Signed)
[facility], [place], [address] Phone: [phone]   Fax:  [phone]  Physical Therapy Treatment  Patient Details  Name: Annette Ayala MRN: [id number] Date of Birth: August 30, 1960 Referring Provider (PT): Dr Dianah Field    Encounter Date: 01/22/2019  PT End of Session - 01/22/19 0957    Visit Number  4    Number of Visits  12    Date for PT Re-Evaluation  02/19/19    PT Start Time  0957    PT Stop Time  1052    PT Time Calculation (min)  55 min    Activity Tolerance  Patient tolerated treatment well       Past Medical History:  Diagnosis Date  . Asthma   . COPD (chronic obstructive pulmonary disease) ([place]) 10/10/2010  . Hypertension   . Weight gain     History reviewed. No pertinent surgical history.  There were no vitals filed for this visit.  Subjective Assessment - 01/22/19 0958    Subjective  Tired - wored over the weekend. Foot is feeling better - still tender and sore, but it is improving. See MD today.     Currently in Pain?  No/denies         [facility] PT Assessment - 01/22/19 0001      Assessment   Medical Diagnosis  Rt plantar fasciitis     Referring Provider (PT)  Dr Dianah Field     Onset Date/Surgical Date  12/21/17   worse in the past 6 months    Hand Dominance  Right    Next MD Visit  01/22/19    Prior Therapy  none       AROM   Right Ankle Dorsiflexion  10    Left Ankle Dorsiflexion  14      PROM   Right Ankle Dorsiflexion  13    Left Ankle Dorsiflexion  15      Strength   Right Ankle Dorsiflexion  5/5    Right Ankle Plantar Flexion  4+/5    Left Ankle Dorsiflexion  5/5    Left Ankle Plantar Flexion  4+/5      Flexibility   Hamstrings  tight Rt > Lt    Quadriceps  tight Rt > Lt    ITB  tight Rt > Lt    Piriformis  tight Rt > Lt       Palpation   Palpation comment  tenderness and tightness to palpation through the Rt plantar surface with TP close to  calcaneous                    [facility] Adult PT Treatment/Exercise - 01/22/19 0001      Knee/Hip Exercises: Sidelying   Hip ABduction  AROM;Strengthening;Right;Left;2 sets;10 reps   hips forward; leading with heel      Cryotherapy   Number Minutes Cryotherapy  12 Minutes    Cryotherapy Location  --   Rt plantar surface of foot   Type of Cryotherapy  Ice pack   sitting     Manual Therapy   Manual Therapy  Soft tissue mobilization    Manual therapy comments  pt prone; ace wrap still in place from toes to calf on RLE    Joint Mobilization  gliding tarsals    Soft tissue mobilization  IASTM plantar surface or Rt foot     Passive ROM  ankle DF       Ankle Exercises:  Aerobic   Nustep  NuStep (legs only) L5, 56min      Ankle Exercises: Standing   SLS  20 sec x 3 reps each LE     Heel Raises  Both;10 reps;2 seconds   UE support as needed for balance      Ankle Exercises: Seated   ABC's  1 rep    Ankle Circles/Pumps  AROM;Right;10 reps             PT Education - 01/22/19 1040    Education Details  HEP     Person(s) Educated  Patient    Methods  Explanation;Demonstration;Tactile cues;Verbal cues;Handout    Comprehension  Verbalized understanding;Returned demonstration;Verbal cues required;Tactile cues required          PT Long Term Goals - 01/08/19 0938      PT LONG TERM GOAL #1   Title  Decrease pain Rt foot by 50-75% allowing patient to perform normal functional activities with minimal pain or limitations 02/19/2019    Time  6    Period  Weeks    Status  New      PT LONG TERM GOAL #2   Title  Increase AROM Rt ankle DF to equal LT 02/19/2019    Time  6    Period  Weeks    Status  New      PT LONG TERM GOAL #3   Title  Patient to demonstrate improve SLS to 10 sec on each LE with minimal to no UE support to improve functional strength and balance 02/19/2019    Time  6    Period  Weeks    Status  New      PT LONG TERM GOAL #4   Title  Independent in  HEP 02/19/2019    Time  6    Period  Weeks    Status  New      PT LONG TERM GOAL #5   Title  Improve FOTO to </= 35% limitation 02/19/2019    Time  6    Period  Weeks    Status  New            Plan - 01/22/19 0957    Clinical Impression Statement  Progressing well. No pain; just some continued soreness. Good gains in ankle ROM and strength. Improved gait pattern. Less palpable tightness. Will benefit from continued treatment to achieve goals of therapy and help prevent recurrent problems.     Rehab Potential  Good    PT Frequency  2x / week    PT Duration  6 weeks    PT Treatment/Interventions  Patient/family education;ADLs/Self Care Home Management;Cryotherapy;Electrical Stimulation;Iontophoresis 4mg /ml Dexamethasone;Moist Heat;Ultrasound;Therapeutic activities;Therapeutic exercise;Neuromuscular re-education;Functional mobility training;Taping;Passive range of motion;Dry needling;Manual techniques    PT Next Visit Plan  manual therapy to Rt foot;  progress exercises as tolerated; continue ionto plantar fascia Rt foot     PT Home Exercise Plan  Access Code: [email or web address]    Consulted and Agree with Plan of Care  Patient       Patient will benefit from skilled therapeutic intervention in order to improve the following deficits and impairments:  Postural dysfunction, Improper body mechanics, Pain, Increased fascial restricitons, Increased muscle spasms, Hypomobility, Decreased range of motion, Decreased mobility, Decreased activity tolerance, Abnormal gait  Visit Diagnosis: Plantar fasciitis of right foot  Pain in right foot  Other abnormalities of gait and mobility     Problem List Patient Active Problem List   Diagnosis Date Noted  .  Skin lesion 01/22/2019  . Benign essential hypertension 01/03/2019  . Plantar fasciitis, right 01/03/2019  . Venous stasis dermatitis of right lower extremity 01/03/2019  . Leiomyoma of leg 01/03/2019    Ascencion Coye Nilda Simmer PT, MPH  01/22/2019, 12:01  PM  [facility] [place], [place], [address] Phone: [phone]   Fax:  [phone]  Name: Annette Ayala MRN: [id number] Date of Birth: 03-Aug-1961

## 2019-01-22 NOTE — Assessment & Plan Note (Signed)
Suspicious skin lesion on the right medial knee, shave biopsy with hyfrecation. Return in 2 weeks for wound check.

## 2019-01-22 NOTE — Assessment & Plan Note (Signed)
Path shows a benign leiomyoma, incision is healing well.

## 2019-01-22 NOTE — Patient Instructions (Signed)
Access Code: [email or web address]  URL: [email or web address]  Date: 01/22/2019  Prepared by: Gillermo Murdoch   Exercises  Soleus Stretch on Wall - 2-3 reps - 1 sets - 20 hold - 2x daily - 7x weekly  Gastroc Stretch on Wall - 2-3 reps - 1 sets - 20 hold - 2x daily - 7x weekly  Seated Hamstring Stretch - 2-3 reps - 1 sets - 30 hold - 2x daily - 7x weekly  Foot Roller Plantar Massage - 1x daily - 7x weekly  Seated Piriformis Stretch - 2-3 reps - 1 sets - 30 hold - 1x daily - 7x weekly  Seated Ankle Alphabet - 10 reps - 1 sets - 2x daily - 7x weekly  Seated Heel Toe Raises - 10 reps - 1 sets - 2x daily - 7x weekly  Sidelying Hip Abduction - 10 reps - 2-3 sets - 2-3 sec hold - 2x daily - 7x weekly  Prone Quadriceps Stretch with Strap - 3 reps - 1 sets - 30 sec hold - 2x daily - 7x weekly  Supine Piriformis Stretch with Leg Straight - 3 reps - 1 sets - 30 sec hold - 2x daily - 7x weekly  Hooklying Hamstring Stretch with Strap - 3 reps - 1 sets - 30 sec hold - 2x daily - 7x weekly  Supine ITB Stretch with Strap - 3 reps - 1 sets - 30 sec hold - 2x daily - 7x weekly   Today  Standing Heel Raise - 10 reps - 1-2 sets - 2-3 sec hold - 2x daily - 7x weekly  Standing Single Leg Stance with Counter Support - 3-5 reps - 1 sets - 20-30 sec hold - 2x daily - 7x weekly  Seated Calf Stretch with Strap - 3 reps - 1 sets - 30 sec hold - 2x daily - 7x weekly

## 2019-01-24 ENCOUNTER — Other Ambulatory Visit: Payer: Self-pay

## 2019-01-24 ENCOUNTER — Encounter: Payer: Self-pay | Admitting: Rehabilitative and Restorative Service Providers"

## 2019-01-24 ENCOUNTER — Ambulatory Visit (INDEPENDENT_AMBULATORY_CARE_PROVIDER_SITE_OTHER): Payer: Managed Care, Other (non HMO) | Admitting: Rehabilitative and Restorative Service Providers"

## 2019-01-24 DIAGNOSIS — M722 Plantar fascial fibromatosis: Secondary | ICD-10-CM | POA: Diagnosis not present

## 2019-01-24 DIAGNOSIS — R2689 Other abnormalities of gait and mobility: Secondary | ICD-10-CM | POA: Diagnosis not present

## 2019-01-24 DIAGNOSIS — M79671 Pain in right foot: Secondary | ICD-10-CM

## 2019-01-24 NOTE — Therapy (Signed)
[facility], [place], [address] Phone: [phone]   Fax:  [phone]  Physical Therapy Treatment  Patient Details  Name: Annette Ayala MRN: [id number] Date of Birth: [birth year] Referring Provider (PT): Dr Dianah Field    Encounter Date: 01/24/2019  PT End of Session - 01/24/19 1054    Visit Number  5    Number of Visits  12    Date for PT Re-Evaluation  02/19/19    PT Start Time  1054    PT Stop Time  1149   ice end of treatment    PT Time Calculation (min)  55 min    Activity Tolerance  Patient tolerated treatment well       Past Medical History:  Diagnosis Date  . Asthma   . COPD (chronic obstructive pulmonary disease) ([place]) 10/10/2010  . Hypertension   . Weight gain     History reviewed. No pertinent surgical history.  There were no vitals filed for this visit.  Subjective Assessment - 01/24/19 1055    Subjective  Doing well - no pain today. Likes the incline board to stretch. he boyfriend is making her one for home.     Currently in Pain?  No/denies                       [facility] Adult PT Treatment/Exercise - 01/24/19 0001      Cryotherapy   Number Minutes Cryotherapy  12 Minutes    Cryotherapy Location  --   Rt plantar surface of foot   Type of Cryotherapy  Ice pack   seated     Iontophoresis   Type of Iontophoresis  Dexamethasone    Location  plantar surface at calcaneous Rt foot     Dose  1 cc/ 80 mAmp/min     Time  6-8 hours       Manual Therapy   Manual Therapy  Soft tissue mobilization    Manual therapy comments  pt prone; ace wrap still in place from toes to calf on RLE    Joint Mobilization  gliding tarsals    Soft tissue mobilization  IASTM plantar surface or Rt foot     Passive ROM  ankle DF       Ankle Exercises: Aerobic   Nustep  NuStep (legs only) L5, 57min      Ankle Exercises: Stretches   Soleus Stretch  2 reps;30 seconds    Gastroc  Stretch  2 reps;30 seconds    Slant Board Stretch  3 reps;30 seconds      Ankle Exercises: Standing   SLS  20 sec x 3 reps each LE - UE support as needed for balance     Heel Raises  Both;10 reps;2 seconds   2 sets - UE support as needed for balance    Other Standing Ankle Exercises  step ups fwd 6 inch step 10 reps x 2 sets       Ankle Exercises: Seated   Towel Crunch  4 reps    Marble Pickup  15-20 reps Rt LE              PT Education - 01/24/19 1150    Education Details  HEP     Person(s) Educated  Patient    Methods  Explanation;Demonstration;Tactile cues;Verbal cues;Handout    Comprehension  Verbalized understanding;Returned demonstration;Verbal cues required;Tactile cues required          PT  Long Term Goals - 01/08/19 0938      PT LONG TERM GOAL #1   Title  Decrease pain Rt foot by 50-75% allowing patient to perform normal functional activities with minimal pain or limitations 02/19/2019    Time  6    Period  Weeks    Status  New      PT LONG TERM GOAL #2   Title  Increase AROM Rt ankle DF to equal LT 02/19/2019    Time  6    Period  Weeks    Status  New      PT LONG TERM GOAL #3   Title  Patient to demonstrate improve SLS to 10 sec on each LE with minimal to no UE support to improve functional strength and balance 02/19/2019    Time  6    Period  Weeks    Status  New      PT LONG TERM GOAL #4   Title  Independent in HEP 02/19/2019    Time  6    Period  Weeks    Status  New      PT LONG TERM GOAL #5   Title  Improve FOTO to </= 35% limitation 02/19/2019    Time  6    Period  Weeks    Status  New            Plan - 01/24/19 1055    Clinical Impression Statement  Continued improvement in symptoms. She is working on exercises and ice at home. progressing well toward stated goals of rehab.     Rehab Potential  Good    PT Frequency  2x / week    PT Duration  6 weeks    PT Treatment/Interventions  Patient/family education;ADLs/Self Care Home  Management;Cryotherapy;Electrical Stimulation;Iontophoresis 4mg /ml Dexamethasone;Moist Heat;Ultrasound;Therapeutic activities;Therapeutic exercise;Neuromuscular re-education;Functional mobility training;Taping;Passive range of motion;Dry needling;Manual techniques    PT Next Visit Plan  manual therapy to Rt foot;  progress exercises as tolerated; continue ionto plantar fascia Rt foot     PT Home Exercise Plan  Access Code: [email or web address]    Consulted and Agree with Plan of Care  Patient       Patient will benefit from skilled therapeutic intervention in order to improve the following deficits and impairments:  Postural dysfunction, Improper body mechanics, Pain, Increased fascial restricitons, Increased muscle spasms, Hypomobility, Decreased range of motion, Decreased mobility, Decreased activity tolerance, Abnormal gait  Visit Diagnosis: Plantar fasciitis of right foot  Pain in right foot  Other abnormalities of gait and mobility     Problem List Patient Active Problem List   Diagnosis Date Noted  . Skin lesion 01/22/2019  . Benign essential hypertension 01/03/2019  . Plantar fasciitis, right 01/03/2019  . Venous stasis dermatitis of right lower extremity 01/03/2019  . Leiomyoma of leg 01/03/2019    Annette Ayala Nilda Simmer PT, MPH  01/24/2019, 11:55 AM  [facility] [place], [place], [address] Phone: [phone]   Fax:  [phone]  Name: Annette Ayala MRN: [id number] Date of Birth: 05/02/1961

## 2019-01-24 NOTE — Patient Instructions (Addendum)
Toe Curl: Unilateral    With right foot resting on towel, slowly bunch up towel by curling toes. Repeat __5-8__ times per set. Do __1-2__ sets per session. Do __1__ sessions per day.  Marble pick up 20-30 reps 1 x/day   Anterior Step-Up    Stand with __6-8_ inch step placed in A direction. Step onto step with right foot without pushing off with the ground foot. Finish with ground foot in touch balance on step and return __10_ times __2-3_ sets __1_ times per day

## 2019-01-26 ENCOUNTER — Encounter: Payer: Self-pay | Admitting: Physician Assistant

## 2019-01-26 ENCOUNTER — Ambulatory Visit (INDEPENDENT_AMBULATORY_CARE_PROVIDER_SITE_OTHER): Payer: Managed Care, Other (non HMO) | Admitting: Physician Assistant

## 2019-01-26 VITALS — BP 180/79 | HR 49 | Temp 98.5°F | Resp 14 | Wt 226.0 lb

## 2019-01-26 DIAGNOSIS — G5602 Carpal tunnel syndrome, left upper limb: Secondary | ICD-10-CM

## 2019-01-26 DIAGNOSIS — J449 Chronic obstructive pulmonary disease, unspecified: Secondary | ICD-10-CM

## 2019-01-26 DIAGNOSIS — Z800 Family history of malignant neoplasm of digestive organs: Secondary | ICD-10-CM

## 2019-01-26 DIAGNOSIS — Z7689 Persons encountering health services in other specified circumstances: Secondary | ICD-10-CM

## 2019-01-26 DIAGNOSIS — Z1231 Encounter for screening mammogram for malignant neoplasm of breast: Secondary | ICD-10-CM

## 2019-01-26 DIAGNOSIS — Z1211 Encounter for screening for malignant neoplasm of colon: Secondary | ICD-10-CM

## 2019-01-26 DIAGNOSIS — Z860100 Personal history of colon polyps, unspecified: Secondary | ICD-10-CM

## 2019-01-26 DIAGNOSIS — I10 Essential (primary) hypertension: Secondary | ICD-10-CM | POA: Diagnosis not present

## 2019-01-26 DIAGNOSIS — R7303 Prediabetes: Secondary | ICD-10-CM | POA: Diagnosis not present

## 2019-01-26 DIAGNOSIS — F341 Dysthymic disorder: Secondary | ICD-10-CM

## 2019-01-26 DIAGNOSIS — T464X5A Adverse effect of angiotensin-converting-enzyme inhibitors, initial encounter: Secondary | ICD-10-CM

## 2019-01-26 DIAGNOSIS — Z8249 Family history of ischemic heart disease and other diseases of the circulatory system: Secondary | ICD-10-CM

## 2019-01-26 DIAGNOSIS — R058 Other specified cough: Secondary | ICD-10-CM

## 2019-01-26 DIAGNOSIS — Z122 Encounter for screening for malignant neoplasm of respiratory organs: Secondary | ICD-10-CM

## 2019-01-26 DIAGNOSIS — R05 Cough: Secondary | ICD-10-CM

## 2019-01-26 HISTORY — DX: Personal history of colonic polyps: Z86.010

## 2019-01-26 HISTORY — DX: Personal history of colon polyps, unspecified: Z86.0100

## 2019-01-26 MED ORDER — CHLORTHALIDONE 25 MG PO TABS: 25.0000 mg | ORAL_TABLET | Freq: Every day | ORAL | 0 refills | Status: DC

## 2019-01-26 MED ORDER — ASPIRIN EC 81 MG PO TBEC: 81.0000 mg | DELAYED_RELEASE_TABLET | Freq: Every day | ORAL | 3 refills | Status: AC

## 2019-01-26 MED ORDER — CANDESARTAN CILEXETIL 32 MG PO TABS: 32.0000 mg | ORAL_TABLET | Freq: Every day | ORAL | 0 refills | Status: DC

## 2019-01-26 NOTE — Progress Notes (Signed)
HPI:                                                                Annette Ayala is a 58 y.o. female who presents to [facility]: [place] today to establish care  HTN: taking Lisinopril-HCTZ 20-25 mg daily. Compliant with medications. Does not check BP's at home.  History of echocardiogram 11/2010 (EF>60%) Reports mother passed away age 54 of a thoracic aortic dissection. She had a CTA in 09/2010 which showed a normal aorta. Denies vision change, headache, chest pain with exertion, orthopnea, lightheadedness, syncope and edema. Risk factors include: HLD, obesity, family hx, postmenopausal  COPD: reports she was diagnosed in 2012. She quit smoking at that time. She has a 30 packyear smoking history. She reports an occasional nonproductive cough. She is having a hard time breathing at work while wearing an N-95 mask. She has never had lung cancer screening. CAT Score 01/26/2019  Total CAT Score 16    Depression/Anxiety: not currently on any psychiatric medications. Endorses anhedonia most days. Denies symptoms of mania/hypomania. Denies suicidal thinking. Denies auditory/visual hallucinations.  Reports numbness in her left 3rd and 4th fingers for several months. She occasionally has parethesias/numbness in her entire left arm that is worse with laying on her left side. She has a history of cervical radiculopathy and underwent 2 neck surgeries including cervical fusion (1999, 2002). She denies any neck pain. Denies weakness in her left arm. She is right-handed and has a hx of carpal tunnel of her right wrist s/p release. She works full time in [employer] and uses both arms for repetitive activities.  Depression screen PHQ 2/9 01/26/2019  Decreased Interest 3  Down, Depressed, Hopeless 0  PHQ - 2 Score 3  Altered sleeping 2  Tired, decreased energy 2  Change in appetite 2  Feeling bad or failure about yourself  2  Trouble concentrating 0  Moving  slowly or fidgety/restless 0  Suicidal thoughts 0  PHQ-9 Score 11    GAD 7 : Generalized Anxiety Score 01/26/2019  Nervous, Anxious, on Edge 0  Control/stop worrying 2  Worry too much - different things 2  Trouble relaxing 2  Restless 0  Easily annoyed or irritable 2  Afraid - awful might happen 0  Total GAD 7 Score 8      Past Medical History:  Diagnosis Date  . Asthma   . Carpal tunnel syndrome of right wrist   . Cervical radiculopathy   . COPD (chronic obstructive pulmonary disease) ([place]) 10/10/2010  . Depression 05/03/2011  . Hyperlipidemia   . Hypertension   . Prediabetes   . Weight gain    Past Surgical History:  Procedure Laterality Date  . ABDOMINAL HYSTERECTOMY    . CARPAL TUNNEL RELEASE Right   . CERVICAL FUSION  2002  . [place]  . TUBAL LIGATION     Social History   Tobacco Use  . Smoking status: Former Smoker    Packs/day: 2.00    Years: 15.00    Pack years: 30.00    Types: Cigarettes    Quit date: 10/10/2010    Years since quitting: 8.3  . Smokeless tobacco: Never Used  Substance Use Topics  . Alcohol use:  Not Currently    Frequency: Never   family history includes AAA (abdominal aortic aneurysm) in her mother; Colon cancer in her brother; Early death in her mother; Hypertension in her mother; Ovarian cancer in her daughter; Stroke in her mother.    ROS: negative except as noted in the HPI  Medications: Current Outpatient Medications  Medication Sig Dispense Refill  . aspirin EC 81 MG tablet Take 1 tablet (81 mg total) by mouth daily. 90 tablet 3  . candesartan (ATACAND) 32 MG tablet Take 1 tablet (32 mg total) by mouth daily. 90 tablet 0  . chlorthalidone (HYGROTON) 25 MG tablet Take 1 tablet (25 mg total) by mouth daily. 90 tablet 0  . doxycycline (VIBRA-TABS) 100 MG tablet Take 1 tablet (100 mg total) by mouth 2 (two) times daily for 7 days. 14 tablet 0  . meloxicam (MOBIC) 15 MG tablet One tab PO qAM with breakfast  for 2 weeks, then daily prn pain. 30 tablet 3   No current facility-administered medications for this visit.    Allergies  Allergen Reactions  . Darvon [Propoxyphene] Other (See Comments)    Intolerance        Objective:  BP (!) 180/79   Pulse (!) 49   Temp 98.5 F (36.9 C) (Oral)   Resp 14   Wt 226 lb (102.5 kg)   SpO2 97%   BMI 37.61 kg/m  Vitals:   01/26/19 1053 01/26/19 1100  BP: (!) 193/78 (!) 180/79  Pulse: (!) 51 (!) 49  Resp:  14  Temp: 98.5 F (36.9 C)   SpO2:  97%    Gen:  alert, not ill-appearing, no distress, appropriate for age HEENT: head normocephalic without obvious abnormality, conjunctiva and cornea clear, trachea midline Pulm: Normal work of breathing, normal phonation, clear to auscultation bilaterally, no wheezes, rales or rhonchi CV: bradycardic rate, regular rhythm, s1 and s2 distinct, no murmurs, clicks or rubs  Neuro: alert and oriented x 3, no tremor MSK: extremities atraumatic, normal gait and station, no peripheral edema Left wrist/hand: atraumatic, normal strength, negative Tinel's Skin: intact, no rashes on exposed skin, no jaundice, no cyanosis Psych: well-groomed, cooperative, good eye contact, euthymic mood, affect mood-congruent, speech is articulate, and thought processes clear and goal-directed  Recent Results (from the past 2160 hour(s))  CBC     Status: None   Collection Time: 01/03/19 10:47 AM  Result Value Ref Range   WBC 9.2 3.8 - 10.8 Thousand/uL   RBC 4.49 3.80 - 5.10 Million/uL   Hemoglobin 13.4 11.7 - 15.5 g/dL   HCT 40.3 35.0 - 45.0 %   MCV 89.8 80.0 - 100.0 fL   MCH 29.8 27.0 - 33.0 pg   MCHC 33.3 32.0 - 36.0 g/dL   RDW 13.0 11.0 - 15.0 %   Platelets 292 140 - 400 Thousand/uL   MPV 10.3 7.5 - 12.5 fL  COMPLETE METABOLIC PANEL WITH GFR     Status: Abnormal   Collection Time: 01/03/19 10:47 AM  Result Value Ref Range   Glucose, Bld 98 65 - 99 mg/dL    Comment: .            Fasting reference interval .     BUN 12 7 - 25 mg/dL   Creat 1.10 (H) 0.50 - 1.05 mg/dL    Comment: For patients >24 years of age, the reference limit for Creatinine is approximately 13% higher for people identified as African-American. .    GFR, Est Non African American  55 (L) > OR = 60 mL/min/1.51m2   GFR, Est African American 64 > OR = 60 mL/min/1.17m2   BUN/Creatinine Ratio 11 6 - 22 (calc)   Sodium 141 135 - 146 mmol/L   Potassium 4.0 3.5 - 5.3 mmol/L   Chloride 102 98 - 110 mmol/L   CO2 30 20 - 32 mmol/L   Calcium 9.8 8.6 - 10.4 mg/dL   Total Protein 7.8 6.1 - 8.1 g/dL   Albumin 4.6 3.6 - 5.1 g/dL   Globulin 3.2 1.9 - 3.7 g/dL (calc)   AG Ratio 1.4 1.0 - 2.5 (calc)   Total Bilirubin 0.4 0.2 - 1.2 mg/dL   Alkaline phosphatase (APISO) 96 37 - 153 U/L   AST 28 10 - 35 U/L   ALT 32 (H) 6 - 29 U/L  Hemoglobin A1c     Status: Abnormal   Collection Time: 01/03/19 10:47 AM  Result Value Ref Range   Hgb A1c MFr Bld 6.2 (H) <5.7 % of total Hgb    Comment: For someone without known diabetes, a hemoglobin  A1c value between 5.7% and 6.4% is consistent with prediabetes and should be confirmed with a  follow-up test. . For someone with known diabetes, a value <7% indicates that their diabetes is well controlled. A1c targets should be individualized based on duration of diabetes, age, comorbid conditions, and other considerations. . This assay result is consistent with an increased risk of diabetes. . Currently, no consensus exists regarding use of hemoglobin A1c for diagnosis of diabetes for children. .    Mean Plasma Glucose 131 (calc)   eAG (mmol/L) 7.3 (calc)  Lipid Panel w/reflex Direct LDL     Status: Abnormal   Collection Time: 01/03/19 10:47 AM  Result Value Ref Range   Cholesterol 289 (H) <200 mg/dL   HDL 46 (L) > OR = 50 mg/dL   Triglycerides 257 (H) <150 mg/dL    Comment: . If a non-fasting specimen was collected, consider repeat triglyceride testing on a fasting specimen if clinically  indicated.  Yates Decamp et al. J. of Clin. Lipidol. 6967;8:938-101. Marland Kitchen    LDL Cholesterol (Calc) 197 (H) mg/dL (calc)    Comment: LDL-C levels > or = 190 mg/dL may indicate familial  hypercholesterolemia (FH). Clinical assessment and  measurement of blood lipid levels should be  considered for all first degree relatives of  patients with an FH diagnosis.  For questions about testing for familial hypercholesterolemia, please call Insurance risk surveyor at [email or web address]. Duncan Dull, et al. J National Lipid Association  Recommendations for Patient-Centered Management of  Dyslipidemia: Part 1 Journal of Clinical Lipidology  2015;9(2), 129-169. Reference range: <100 . Desirable range <100 mg/dL for primary prevention;   <70 mg/dL for patients with CHD or diabetic patients  with > or = 2 CHD risk factors. Marland Kitchen LDL-C is now calculated using the Martin-Hopkins  calculation, which is a validated novel method providing  better accuracy than the Friedewald equation in the  estimation of LDL-C.  Cresenciano Genre et al. Annamaria Helling. 7510;258(52): 2061-2068  (http://education.Quest[employer].com/faq/FAQ164)    Total CHOL/HDL Ratio 6.3 (H) <5.0 (calc)   Non-HDL Cholesterol (Calc) 243 (H) <130 mg/dL (calc)    Comment: Non-HDL level > or = 220 is very high and may indicate  genetic familial hypercholesterolemia (FH). Clinical  assessment and measurement of blood lipid levels  should be considered for all first-degree relatives  of patients with an FH diagnosis. . For patients with diabetes plus 1 major ASCVD risk  factor, treating to a non-HDL-C goal of <100 mg/dL  (LDL-C of <70 mg/dL) is considered a therapeutic  option.   TSH     Status: None   Collection Time: 01/03/19 10:47 AM  Result Value Ref Range   TSH 2.88 0.40 - 4.50 mIU/L    No results found for this or any previous visit (from the past 72 hour(s)). No results found.    Assessment and Plan: 58 y.o. female with   .Shevon was  seen today for establish care.  Diagnoses and all orders for this visit:  Encounter to establish care  Prediabetes  Breast cancer screening by mammogram -     MM 3D SCREEN BREAST BILATERAL; Future  Hypertension goal BP (blood pressure) < 130/80 -     candesartan (ATACAND) 32 MG tablet; Take 1 tablet (32 mg total) by mouth daily. -     chlorthalidone (HYGROTON) 25 MG tablet; Take 1 tablet (25 mg total) by mouth daily. -     aspirin EC 81 MG tablet; Take 1 tablet (81 mg total) by mouth daily.  Chronic obstructive pulmonary disease, unspecified COPD type ([place])  Uncontrolled stage 2 hypertension -     candesartan (ATACAND) 32 MG tablet; Take 1 tablet (32 mg total) by mouth daily. -     chlorthalidone (HYGROTON) 25 MG tablet; Take 1 tablet (25 mg total) by mouth daily.  Family history of colon cancer Comments: brother Orders: -     Cancel: Ambulatory referral to Gastroenterology -     Ambulatory referral to Gastroenterology  Dysthymia  Colon cancer screening -     Cancel: Ambulatory referral to Gastroenterology -     Ambulatory referral to Gastroenterology  History of colon polyps -     Ambulatory referral to Gastroenterology  Encounter for screening for malignant neoplasm of respiratory organs -     CT CHEST LUNG CA SCREEN LOW DOSE W/O CM  Carpal tunnel syndrome of left wrist  Family history of dissection of thoracic aorta Comments: Mother (deceased age 53)  Cough due to ACE inhibitor   - Personally reviewed PMH, PSH, PFH, medications, allergies, HM - Age-appropriate cancer screening: overdue for lung cancer screening (former smoker, quit in the last 10 yrs, >30 packyear smoking hx; overdue for colon cancer screening, referral placed; mammogram ordered - Other screening: possible aortic aneurysm screening due to family hx, will await results of Chest CT which should show thoracic aorta - Tdap UTD - PHQ2 positive, no acute safety issues  Uncontrolled HTN D/c  Lisinopril due to possible cough Start candesartan Switch HCTZ to Chlorthalidone Baby asa for primary prevention Counseled on therapeutic lifestyle changes  Left hand numbness DDx includes carpal tunnel syndrome versus cervical radiculopathy Home rehab exercises daily for 1 month Nighttime splinting daily x 1 month Follow-up with Sports Medicine in 1 month   Patient education and anticipatory guidance given Patient agrees with treatment plan Follow-up in 1 month for HTN or sooner as needed if symptoms worsen or fail to improve  Darlyne Russian PA-C

## 2019-01-26 NOTE — Patient Instructions (Addendum)
For your blood pressure: - Goal <130/80 (Ideally 120's/70's) - stop Lisinopril-HCTZ - start Chlorthalidone and Candesartan. Option to take Chlorthalidone in the morning and Candesartan in the evening - take baby aspirin 81 mg daily to help prevent heart attack/stroke - monitor and log blood pressures at home - check around the same time each day in a relaxed setting - Limit salt to <2500 mg/day - Follow DASH (Dietary Approach to Stopping Hypertension) eating plan - Try to get at least 150 minutes of aerobic exercise per week - Aim to go on a brisk walk 30 minutes per day at least 5 days per week. If you're not active, gradually increase how long you walk by 5 minutes each week - limit alcohol: 2 standard drinks per day for men and 1 per day for women - avoid tobacco/nicotine products. Consider smoking cessation if you smoke - weight loss: 7% of current body weight can reduce your blood pressure by 5-10 points - follow-up at least every 6 months for your blood pressure. Follow-up sooner if your BP is not controlled   Prediabetes Eating Plan Prediabetes is a condition that causes blood sugar (glucose) levels to be higher than normal. This increases the risk for developing diabetes. In order to prevent diabetes from developing, your health care provider may recommend a diet and other lifestyle changes to help you:  Control your blood glucose levels.  Improve your cholesterol levels.  Manage your blood pressure. Your health care provider may recommend working with a diet and nutrition specialist (dietitian) to make a meal plan that is best for you. What are tips for following this plan? Lifestyle  Set weight loss goals with the help of your health care team. It is recommended that most people with prediabetes lose 7% of their current body weight.  Exercise for at least 30 minutes at least 5 days a week.  Attend a support group or seek ongoing support from a mental health  counselor.  Take over-the-counter and prescription medicines only as told by your health care provider. Reading food labels  Read food labels to check the amount of fat, salt (sodium), and sugar in prepackaged foods. Avoid foods that have: ? Saturated fats. ? Trans fats. ? Added sugars.  Avoid foods that have more than 300 milligrams (mg) of sodium per serving. Limit your daily sodium intake to less than 2,300 mg each day. Shopping  Avoid buying pre-made and processed foods. Cooking  Cook with olive oil. Do not use butter, lard, or ghee.  Bake, broil, grill, or boil foods. Avoid frying. Meal planning   Work with your dietitian to develop an eating plan that is right for you. This may include: ? Tracking how many calories you take in. Use a food diary, notebook, or mobile application to track what you eat at each meal. ? Using the glycemic index (GI) to plan your meals. The index tells you how quickly a food will raise your blood glucose. Choose low-GI foods. These foods take a longer time to raise blood glucose.  Consider following a Mediterranean diet. This diet includes: ? Several servings each day of fresh fruits and vegetables. ? Eating fish at least twice a week. ? Several servings each day of whole grains, beans, nuts, and seeds. ? Using olive oil instead of other fats. ? Moderate alcohol consumption. ? Eating small amounts of red meat and whole-fat dairy.  If you have high blood pressure, you may need to limit your sodium intake or follow a  diet such as the DASH eating plan. DASH is an eating plan that aims to lower high blood pressure. What foods are recommended? The items listed below may not be a complete list. Talk with your dietitian about what dietary choices are best for you. Grains Whole grains, such as whole-wheat or whole-grain breads, crackers, cereals, and pasta. Unsweetened oatmeal. Bulgur. Barley. Quinoa. Brown rice. Corn or whole-wheat flour tortillas or  taco shells. Vegetables Lettuce. Spinach. Peas. Beets. Cauliflower. Cabbage. Broccoli. Carrots. Tomatoes. Squash. Eggplant. Herbs. Peppers. Onions. Cucumbers. Brussels sprouts. Fruits Berries. Bananas. Apples. Oranges. Grapes. Papaya. Mango. Pomegranate. Kiwi. Grapefruit. Cherries. Meats and other protein foods Seafood. Poultry without skin. Lean cuts of pork and beef. Tofu. Eggs. Nuts. Beans. Dairy Low-fat or fat-free dairy products, such as yogurt, cottage cheese, and cheese. Beverages Water. Tea. Coffee. Sugar-free or diet soda. Seltzer water. Lowfat or no-fat milk. Milk alternatives, such as soy or almond milk. Fats and oils Olive oil. Canola oil. Sunflower oil. Grapeseed oil. Avocado. Walnuts. Sweets and desserts Sugar-free or low-fat pudding. Sugar-free or low-fat ice cream and other frozen treats. Seasoning and other foods Herbs. Sodium-free spices. Mustard. Relish. Low-fat, low-sugar ketchup. Low-fat, low-sugar barbecue sauce. Low-fat or fat-free mayonnaise. What foods are not recommended? The items listed below may not be a complete list. Talk with your dietitian about what dietary choices are best for you. Grains Refined white flour and flour products, such as bread, pasta, snack foods, and cereals. Vegetables Canned vegetables. Frozen vegetables with butter or cream sauce. Fruits Fruits canned with syrup. Meats and other protein foods Fatty cuts of meat. Poultry with skin. Breaded or fried meat. Processed meats. Dairy Full-fat yogurt, cheese, or milk. Beverages Sweetened drinks, such as sweet iced tea and soda. Fats and oils Butter. Lard. Ghee. Sweets and desserts Baked goods, such as cake, cupcakes, pastries, cookies, and cheesecake. Seasoning and other foods Spice mixes with added salt. Ketchup. Barbecue sauce. Mayonnaise. Summary  To prevent diabetes from developing, you may need to make diet and other lifestyle changes to help control blood sugar, improve  cholesterol levels, and manage your blood pressure.  Set weight loss goals with the help of your health care team. It is recommended that most people with prediabetes lose 7 percent of their current body weight.  Consider following a Mediterranean diet that includes plenty of fresh fruits and vegetables, whole grains, beans, nuts, seeds, fish, lean meat, low-fat dairy, and healthy oils. This information is not intended to replace advice given to you by your health care provider. Make sure you discuss any questions you have with your health care provider. Document Released: 12/24/2014 Document Revised: 10/13/2016 Document Reviewed: 10/13/2016 Elsevier Interactive Patient Education  2019 [employer].

## 2019-01-30 ENCOUNTER — Other Ambulatory Visit: Payer: Self-pay

## 2019-01-30 ENCOUNTER — Encounter: Payer: Self-pay | Admitting: Physical Therapy

## 2019-01-30 ENCOUNTER — Ambulatory Visit (INDEPENDENT_AMBULATORY_CARE_PROVIDER_SITE_OTHER): Payer: Managed Care, Other (non HMO) | Admitting: Physical Therapy

## 2019-01-30 VITALS — BP 187/104 | HR 59

## 2019-01-30 DIAGNOSIS — M722 Plantar fascial fibromatosis: Secondary | ICD-10-CM

## 2019-01-30 DIAGNOSIS — M79671 Pain in right foot: Secondary | ICD-10-CM | POA: Diagnosis not present

## 2019-01-30 DIAGNOSIS — R2689 Other abnormalities of gait and mobility: Secondary | ICD-10-CM

## 2019-01-30 NOTE — Therapy (Addendum)
[facility] [place] [facility], [place], [address] Phone: [phone]   Fax:  [phone]  Physical Therapy Treatment  Patient Details  Name: Annette Ayala MRN: [id number] Date of Birth: 01/09/1961 Referring Provider (PT): Dr Dianah Field    Encounter Date: 01/30/2019  PT End of Session - 01/30/19 1102    Visit Number  6    Number of Visits  12    Date for PT Re-Evaluation  02/19/19    PT Start Time  1102    PT Stop Time  1147    PT Time Calculation (min)  45 min    Activity Tolerance  Patient tolerated treatment well    Behavior During Therapy  [facility] for tasks assessed/performed       Past Medical History:  Diagnosis Date  . Asthma   . Carpal tunnel syndrome of right wrist   . Cervical radiculopathy   . COPD (chronic obstructive pulmonary disease) ([place]) 10/10/2010  . Depression 05/03/2011  . Hyperlipidemia   . Hypertension   . Prediabetes   . Weight gain     Past Surgical History:  Procedure Laterality Date  . ABDOMINAL HYSTERECTOMY    . CARPAL TUNNEL RELEASE Right   . CERVICAL FUSION  2002  . [place]  . TUBAL LIGATION      Vitals:   01/30/19 1107  BP: (!) 187/104  Pulse: (!) 59    Subjective Assessment - 01/30/19 1108    Subjective  Pt reports she is doing well.  She has been doing exercises everyday. She has tenderness in her foot in the morning, but it resolves after completing AM.   She states her MD is adjusting her blood pressure meds and she has been feeling weird.     Patient Stated Goals  Patient wants to get rid of foot pain     Currently in Pain?  No/denies    Pain Score  0-No pain         [facility] PT Assessment - 01/30/19 0001      Assessment   Medical Diagnosis  Rt plantar fasciitis     Referring Provider (PT)  Dr Dianah Field     Onset Date/Surgical Date  12/21/17   worse in the past 6 months    Hand Dominance  Right    Next MD Visit  02/05/19    Prior  Therapy  none       AROM   Right Ankle Dorsiflexion  8    Left Ankle Dorsiflexion  11      PROM   Right Ankle Dorsiflexion  11    Left Ankle Dorsiflexion  13       [facility] Adult PT Treatment/Exercise - 01/30/19 0001      Iontophoresis   Type of Iontophoresis  Dexamethasone    Location  plantar surface at calcaneous Rt foot     Dose  1 cc/ 80 mAmp/min     Time  6-8 hours       Manual Therapy   Manual Therapy  Soft tissue mobilization    Manual therapy comments  pt prone -  Then ice massage with water bottle to bottom of foot prior to ionto patch application   Soft tissue mobilization  IASTM plantar surface or Rt foot     Passive ROM  ankle DF       Ankle Exercises: Aerobic   Nustep  NuStep (legs only) L5-4, 70mn  Ankle Exercises: Stretches   Soleus Stretch  2 reps;30 seconds    Gastroc Stretch  2 reps;30 seconds      Ankle Exercises: Standing   SLS  3 reps of SLS  10, 15, and 22 sec each leg.    Other Standing Ankle Exercises  forward step down with LLE, retro step up with RLE on 6" step x 15 reps      Ankle Exercises: Seated   Ankle Circles/Pumps  AROM;10 reps;Right    Towel Crunch  4 reps    Towel Inversion/Eversion  3 reps   shoe on towel        PT Long Term Goals - 01/30/19 1111      PT LONG TERM GOAL #1   Title  Decrease pain Rt foot by 50-75% allowing patient to perform normal functional activities with minimal pain or limitations 02/19/2019    Baseline  pt reports 95% decrease in pain.     Time  6    Period  Weeks    Status  Achieved      PT LONG TERM GOAL #2   Title  Increase AROM Rt ankle DF to equal LT 02/19/2019    Baseline  improved    Time  6    Period  Weeks    Status  On-going      PT LONG TERM GOAL #3   Title  Patient to demonstrate improve SLS to 10 sec on each LE with minimal to no UE support to improve functional strength and balance 02/19/2019    Time  6    Period  Weeks    Status  Achieved      PT LONG TERM GOAL #4   Title   Independent in HEP 02/19/2019    Time  6    Period  Weeks    Status  On-going      PT LONG TERM GOAL #5   Title  Improve FOTO to </= 35% limitation 02/19/2019    Time  6    Period  Weeks    Status  On-going            Plan - 01/30/19 1155    Clinical Impression Statement  Pt has reported 90% improvement in symptoms in Rt foot; has met LTG#1.  She tolerated all exercises well.  DF ROM is near Lt foot, but not equal.  Pt had some tenderness with STM to plantar fascia.  Pt requests to hold therapy while she works on [employer].  Goals are partially met.     PT Frequency  2x / week    PT Duration  6 weeks    PT Treatment/Interventions  Patient/family education;ADLs/Self Care Home Management;Cryotherapy;Electrical Stimulation;Iontophoresis 23m/ml Dexamethasone;Moist Heat;Ultrasound;Therapeutic activities;Therapeutic exercise;Neuromuscular re-education;Functional mobility training;Taping;Passive range of motion;Dry needling;Manual techniques    PT Next Visit Plan  spoke to supervising PT; will hold PT per pt request. if pt doesn't return by 03/01/19, will d/c.     PT Home Exercise Plan  Access Code: [email or web address]   Consulted and Agree with Plan of Care  Patient       Patient will benefit from skilled therapeutic intervention in order to improve the following deficits and impairments:  Postural dysfunction, Improper body mechanics, Pain, Increased fascial restricitons, Increased muscle spasms, Hypomobility, Decreased range of motion, Decreased mobility, Decreased activity tolerance, Abnormal gait  Visit Diagnosis: Plantar fasciitis of right foot  Pain in right foot  Other abnormalities of gait and mobility  Problem List Patient Active Problem List   Diagnosis Date Noted  . Prediabetes 01/26/2019  . Hypertension goal BP (blood pressure) < 130/80 01/26/2019  . Uncontrolled stage 2 hypertension 01/26/2019  . Chronic obstructive pulmonary disease ([place]) 01/26/2019  . Family history of colon  cancer 01/26/2019  . Carpal tunnel syndrome of left wrist 01/26/2019  . Family history of dissection of thoracic aorta 01/26/2019  . History of colon polyps 01/26/2019  . Skin lesion 01/22/2019  . Benign essential hypertension 01/03/2019  . Plantar fasciitis, right 01/03/2019  . Venous stasis dermatitis of right lower extremity 01/03/2019  . Leiomyoma of leg 01/03/2019  . Varicose veins with pain 12/29/2015  . Depression 05/03/2011  . Hyperlipidemia, unspecified 05/03/2011  . Low back pain 05/03/2011  . Obesity (BMI 35.0-39.9 without comorbidity) 05/03/2011   Kerin Perna, PTA 01/30/19 12:00 PM  [place], [place], [address] Phone: [phone]   Fax:  [phone]  Name: Annette Ayala MRN: [id number] Date of Birth: 01-12-1961  PHYSICAL THERAPY DISCHARGE SUMMARY  Visits from Start of Care: 6  Current functional level related to goals / functional outcomes: See last progress note for discharge status    Remaining deficits: Needs to continue with HEP    Education / Equipment: HEP  Plan: Patient agrees to discharge.  Patient goals were met. Patient is being discharged due to meeting the stated rehab goals.  ?????    Celyn P. Helene Kelp PT, MPH 03/07/19 1:03 PM

## 2019-01-30 NOTE — Patient Instructions (Addendum)
TOES: Towel Bunching    With involved straight toes on towel, bend toes bunching up towel _3-5__ times. Do _1__ times per day.  Forefoot Invertors    Place right foot flat on towel, knee pointed forward. Use forefoot and toes to pull towel in toward center. Do not allow heel or knee to move. Hold __1__ seconds. Repeat __3-5__ times. Do __1__ sessions per day. CAUTION: Repetitions should be slow and controlled.  Forefoot Evertors    Place right foot flat on towel, knee pointed forward. Use forefoot and toes to push towel out to side. Do not allow heel or knee to move. Hold __1__ seconds. Repeat __3-5__ times. Do __1__ sessions per day. CAUTION: Repetitions should be slow and controlled.  Copyright  VHI. All rights reserved.

## 2019-02-01 ENCOUNTER — Encounter: Payer: Managed Care, Other (non HMO) | Admitting: Physical Therapy

## 2019-02-05 ENCOUNTER — Encounter: Payer: Self-pay | Admitting: Sports Medicine

## 2019-02-05 ENCOUNTER — Ambulatory Visit (INDEPENDENT_AMBULATORY_CARE_PROVIDER_SITE_OTHER): Payer: Managed Care, Other (non HMO) | Admitting: Sports Medicine

## 2019-02-05 DIAGNOSIS — D219 Benign neoplasm of connective and other soft tissue, unspecified: Secondary | ICD-10-CM

## 2019-02-05 DIAGNOSIS — G5602 Carpal tunnel syndrome, left upper limb: Secondary | ICD-10-CM

## 2019-02-05 DIAGNOSIS — L989 Disorder of the skin and subcutaneous tissue, unspecified: Secondary | ICD-10-CM

## 2019-02-05 MED ORDER — DOXYCYCLINE HYCLATE 100 MG PO TABS: 100.0000 mg | ORAL_TABLET | Freq: Two times a day (BID) | ORAL | 0 refills | Status: AC

## 2019-02-05 NOTE — Assessment & Plan Note (Signed)
Wound did dehisce, I remove the suture, there was a bit of purulent drainage. I debrided the wound, and we are doing 7 days of doxycycline. Change dressings daily.

## 2019-02-05 NOTE — Progress Notes (Signed)
Subjective:    CC: Follow-up  HPI: Hand numbness: Left-sided worse at night, third and fourth fingers.  Improved considerably with nighttime splinting, no daytime symptoms, no work symptoms, happy with how things are going.  Postop sites: There was a dehiscence of the left lower leg surgical site, her right anterior knee site also appears infected.  I reviewed the past medical history, family history, social history, surgical history, and allergies today and no changes were needed.  Please see the problem list section below in epic for further details.  Past Medical History: Past Medical History:  Diagnosis Date  . Asthma   . Carpal tunnel syndrome of right wrist   . Cervical radiculopathy   . COPD (chronic obstructive pulmonary disease) ([place]) 10/10/2010  . Depression 05/03/2011  . Hyperlipidemia   . Hypertension   . Prediabetes   . Weight gain    Past Surgical History: Past Surgical History:  Procedure Laterality Date  . ABDOMINAL HYSTERECTOMY    . CARPAL TUNNEL RELEASE Right   . CERVICAL FUSION  2002  . [place]  . TUBAL LIGATION     Social History: Social History   Socioeconomic History  . Marital status: Single    Spouse name: Not on file  . Number of children: 2  . Years of education: Not on file  . Highest education level: High school graduate  Occupational History  . Not on file  Social Needs  . Financial resource strain: Patient refused  . Food insecurity    Worry: Not on file    Inability: Not on file  . Transportation needs    Medical: Not on file    Non-medical: Not on file  Tobacco Use  . Smoking status: Former Smoker    Packs/day: 2.00    Years: 15.00    Pack years: 30.00    Types: Cigarettes    Quit date: 10/10/2010    Years since quitting: 8.3  . Smokeless tobacco: Never Used  Substance and Sexual Activity  . Alcohol use: Not Currently    Frequency: Never  . Drug use: Never  . Sexual activity: Yes    Birth  control/protection: Surgical  Lifestyle  . Physical activity    Days per week: Not on file    Minutes per session: Not on file  . Stress: Not on file  Relationships  . Social Herbalist on phone: Not on file    Gets together: Not on file    Attends religious service: Not on file    Active member of club or organization: Not on file    Attends meetings of clubs or organizations: Not on file    Relationship status: Not on file  Other Topics Concern  . Not on file  Social History Narrative  . Not on file   Family History: Family History  Problem Relation Age of Onset  . Stroke Mother   . AAA (abdominal aortic aneurysm) Mother   . Early death Mother   . Hypertension Mother   . Colon cancer Brother   . Ovarian cancer Daughter   . Cancer Neg Hx   . Birth defects Neg Hx   . Diabetes Neg Hx    Allergies: Allergies  Allergen Reactions  . Darvon [Propoxyphene] Other (See Comments)    Intolerance    Medications: See med rec.  Review of Systems: No fevers, chills, night sweats, weight loss, chest pain, or shortness of breath.   Objective:  General: Well Developed, well nourished, and in no acute distress.  Neuro: Alert and oriented x3, extra-ocular muscles intact, sensation grossly intact.  HEENT: Normocephalic, atraumatic, pupils equal round reactive to light, neck supple, no masses, no lymphadenopathy, thyroid nonpalpable.  Skin: Warm and dry, no rashes. Cardiac: Regular rate and rhythm, no murmurs rubs or gallops, no lower extremity edema.  Respiratory: Clear to auscultation bilaterally. Not using accessory muscles, speaking in full sentences. Surgical sites: Minimal purulent drainage, mildly erythematous, warm.  The sites were debrided aggressively, remaining suture removed and dressed with antibiotic ointment. Left wrist: Inspection normal with no visible erythema or swelling. ROM smooth and normal with good flexion and extension and ulnar/radial deviation  that is symmetrical with opposite wrist. Palpation is normal over metacarpals, navicular, lunate, and TFCC; tendons without tenderness/ swelling No snuffbox tenderness. No tenderness over Canal of [place]. Strength 5/5 in all directions without pain. Positive Tinel sign, negative Phalen sign. Negative Finkelstein sign. Negative Watson's test.  Impression and Recommendations:    Leiomyoma of leg Wound did dehisce, I remove the suture, there was a bit of purulent drainage. I debrided the wound, and we are doing 7 days of doxycycline. Change dressings daily.  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 Incision has not yet healed, there is also some purulent drainage. Adding a Band-Aid, topical antibiotic ointment. Change dressing daily.  Carpal tunnel syndrome of left wrist Doing well with nighttime splinting, she tells me she does not have any symptoms during the day or at work, no change in plan.   ___________________________________________ Gwen Her. Dianah Field, M.D., ABFM., CAQSM. Primary Care and Sports Medicine [facility]  Adjunct Professor of [employer] [facility] [facility] [employer]

## 2019-02-05 NOTE — Assessment & Plan Note (Signed)
Doing well with nighttime splinting, she tells me she does not have any symptoms during the day or at work, no change in plan.

## 2019-02-05 NOTE — Assessment & Plan Note (Signed)
Skin lesion was a dermatofibroma. Incision has not yet healed, there is also some purulent drainage. Adding a Band-Aid, topical antibiotic ointment. Change dressing daily.

## 2019-02-05 NOTE — Patient Instructions (Signed)
Change dressings for the right and left legs daily with antibiotic ointment.

## 2019-02-06 DIAGNOSIS — T464X5A Adverse effect of angiotensin-converting-enzyme inhibitors, initial encounter: Secondary | ICD-10-CM | POA: Insufficient documentation

## 2019-02-06 DIAGNOSIS — R05 Cough: Secondary | ICD-10-CM | POA: Insufficient documentation

## 2019-02-09 ENCOUNTER — Ambulatory Visit (INDEPENDENT_AMBULATORY_CARE_PROVIDER_SITE_OTHER): Payer: Managed Care, Other (non HMO) | Admitting: Physician Assistant

## 2019-02-09 ENCOUNTER — Encounter: Payer: Self-pay | Admitting: Physician Assistant

## 2019-02-09 VITALS — BP 123/73 | HR 51 | Temp 98.3°F | Wt 224.0 lb

## 2019-02-09 DIAGNOSIS — I10 Essential (primary) hypertension: Secondary | ICD-10-CM | POA: Diagnosis not present

## 2019-02-09 DIAGNOSIS — E782 Mixed hyperlipidemia: Secondary | ICD-10-CM | POA: Diagnosis not present

## 2019-02-09 MED ORDER — ATORVASTATIN CALCIUM 20 MG PO TABS: 20.0000 mg | ORAL_TABLET | Freq: Every day | ORAL | 3 refills | Status: DC

## 2019-02-09 NOTE — Progress Notes (Signed)
HPI:                                                                Annette LIPSEY is a 58 y.o. female who presents to [facility] today for hypertension follow-up  HTN: At last office visit she was switched from Lisinopril-HCTZ 20-25 mg to candesartan 32 mg and chlorthalidone 25 mg.   Has been monitoring blood pressure at home: Systolic blood pressure range 1 60-1 68 Diastolic blood pressure range 66-83 She has been doing a healthy lifestyle program called [employer] and has lost 3 lb in the last 2 weeks History of echocardiogram 11/2010 (EF>60%) Reports mother passed away age 70 of a thoracic aortic dissection. She had a CTA in 09/2010 which showed a normal aorta. Denies vision change, headache, chest pain with exertion, orthopnea, lightheadedness, syncope and edema. Risk factors include: HLD, obesity, family hx, postmenopausal  COPD: reports she was diagnosed in 2012. She quit smoking at that time. She has a 30 packyear smoking history.  She has scheduled her CT for lung cancer screening next month. CAT Score 01/26/2019  Total CAT Score 16    Depression/Anxiety: not currently on any psychiatric medications. She states since her last office visit and starting her weight loss program her mood has improved a lot and she feels great. Endorses anhedonia most days. Denies symptoms of mania/hypomania. Denies suicidal thinking. Denies auditory/visual hallucinations.  She is currently being treated with Doxycycline for an infected incision on her left leg from a lipoma removal. She reports area is sore. Denies any drainge, warmth or redness.  Past Medical History:  Diagnosis Date  . Asthma   . Carpal tunnel syndrome of right wrist   . Cervical radiculopathy   . COPD (chronic obstructive pulmonary disease) ([place]) 10/10/2010  . Depression 05/03/2011  . Hyperlipidemia   . Hypertension   . Prediabetes   . Weight gain    Past Surgical History:   Procedure Laterality Date  . ABDOMINAL HYSTERECTOMY    . CARPAL TUNNEL RELEASE Right   . CERVICAL FUSION  2002  . [place]  . TUBAL LIGATION     Social History   Tobacco Use  . Smoking status: Former Smoker    Packs/day: 2.00    Years: 15.00    Pack years: 30.00    Types: Cigarettes    Quit date: 10/10/2010    Years since quitting: 8.3  . Smokeless tobacco: Never Used  Substance Use Topics  . Alcohol use: Not Currently    Frequency: Never   family history includes AAA (abdominal aortic aneurysm) in her mother; Colon cancer in her brother; Early death in her mother; Hypertension in her mother; Ovarian cancer in her daughter; Stroke in her mother.    ROS: negative except as noted in the HPI  Medications: Current Outpatient Medications  Medication Sig Dispense Refill  . aspirin EC 81 MG tablet Take 1 tablet (81 mg total) by mouth daily. 90 tablet 3  . candesartan (ATACAND) 32 MG tablet Take 1 tablet (32 mg total) by mouth daily. 90 tablet 0  . chlorthalidone (HYGROTON) 25 MG tablet Take 1 tablet (25 mg total) by mouth daily. 90 tablet 0  . doxycycline (VIBRA-TABS) 100  MG tablet Take 1 tablet (100 mg total) by mouth 2 (two) times daily for 7 days. 14 tablet 0  . OVER THE COUNTER MEDICATION [facility]rive B vitamins, D3, chromium, selenium, vanadium    . atorvastatin (LIPITOR) 20 MG tablet Take 1 tablet (20 mg total) by mouth daily. 90 tablet 3   No current facility-administered medications for this visit.    Allergies  Allergen Reactions  . Darvon [Propoxyphene] Other (See Comments)    Intolerance        Objective:  BP 123/73   Pulse (!) 51   Temp 98.3 F (36.8 C) (Oral)   Wt 224 lb (101.6 kg)   SpO2 94%   BMI 37.28 kg/m  Vitals:   02/09/19 1034 02/09/19 1059  BP: 123/73   Pulse: 96 (!) 51  Temp: 98.3 F (36.8 C)   SpO2:  94%  Gen:  alert, not ill-appearing, no distress, appropriate for age HEENT: head normocephalic without obvious  abnormality, conjunctiva and cornea clear, trachea midline Pulm: Normal work of breathing, normal phonation, clear to auscultation bilaterally, no wheezes, rales or rhonchi CV: bradycardic rate, regular rhythm, s1 and s2 distinct, no murmurs, clicks or rubs  Neuro: alert and oriented x 3, no tremor MSK: extremities atraumatic, normal gait and station, no peripheral edema Skin: left medial calf    Psych: well-groomed, cooperative, good eye contact, euthymic mood, affect mood-congruent, speech is articulate, and thought processes clear and goal-directed  BP Readings from Last 3 Encounters:  02/09/19 123/73  02/05/19 (!) 150/71  01/30/19 (!) 187/104   Pulse Readings from Last 3 Encounters:  02/09/19 (!) 51  02/05/19 (!) 52  01/30/19 (!) 59   Wt Readings from Last 3 Encounters:  02/09/19 224 lb (101.6 kg)  02/05/19 223 lb (101.2 kg)  01/26/19 226 lb (102.5 kg)     Recent Results (from the past 2160 hour(s))  CBC     Status: None   Collection Time: 01/03/19 10:47 AM  Result Value Ref Range   WBC 9.2 3.8 - 10.8 Thousand/uL   RBC 4.49 3.80 - 5.10 Million/uL   Hemoglobin 13.4 11.7 - 15.5 g/dL   HCT 40.3 35.0 - 45.0 %   MCV 89.8 80.0 - 100.0 fL   MCH 29.8 27.0 - 33.0 pg   MCHC 33.3 32.0 - 36.0 g/dL   RDW 13.0 11.0 - 15.0 %   Platelets 292 140 - 400 Thousand/uL   MPV 10.3 7.5 - 12.5 fL  COMPLETE METABOLIC PANEL WITH GFR     Status: Abnormal   Collection Time: 01/03/19 10:47 AM  Result Value Ref Range   Glucose, Bld 98 65 - 99 mg/dL    Comment: .            Fasting reference interval .    BUN 12 7 - 25 mg/dL   Creat 1.10 (H) 0.50 - 1.05 mg/dL    Comment: For patients >55 years of age, the reference limit for Creatinine is approximately 13% higher for people identified as African-American. .    GFR, Est Non African American 55 (L) > OR = 60 mL/min/1.47m2   GFR, Est African American 64 > OR = 60 mL/min/1.63m2   BUN/Creatinine Ratio 11 6 - 22 (calc)   Sodium 141 135 -  146 mmol/L   Potassium 4.0 3.5 - 5.3 mmol/L   Chloride 102 98 - 110 mmol/L   CO2 30 20 - 32 mmol/L   Calcium 9.8 8.6 - 10.4 mg/dL   Total Protein  7.8 6.1 - 8.1 g/dL   Albumin 4.6 3.6 - 5.1 g/dL   Globulin 3.2 1.9 - 3.7 g/dL (calc)   AG Ratio 1.4 1.0 - 2.5 (calc)   Total Bilirubin 0.4 0.2 - 1.2 mg/dL   Alkaline phosphatase (APISO) 96 37 - 153 U/L   AST 28 10 - 35 U/L   ALT 32 (H) 6 - 29 U/L  Hemoglobin A1c     Status: Abnormal   Collection Time: 01/03/19 10:47 AM  Result Value Ref Range   Hgb A1c MFr Bld 6.2 (H) <5.7 % of total Hgb    Comment: For someone without known diabetes, a hemoglobin  A1c value between 5.7% and 6.4% is consistent with prediabetes and should be confirmed with a  follow-up test. . For someone with known diabetes, a value <7% indicates that their diabetes is well controlled. A1c targets should be individualized based on duration of diabetes, age, comorbid conditions, and other considerations. . This assay result is consistent with an increased risk of diabetes. . Currently, no consensus exists regarding use of hemoglobin A1c for diagnosis of diabetes for children. .    Mean Plasma Glucose 131 (calc)   eAG (mmol/L) 7.3 (calc)  Lipid Panel w/reflex Direct LDL     Status: Abnormal   Collection Time: 01/03/19 10:47 AM  Result Value Ref Range   Cholesterol 289 (H) <200 mg/dL   HDL 46 (L) > OR = 50 mg/dL   Triglycerides 257 (H) <150 mg/dL    Comment: . If a non-fasting specimen was collected, consider repeat triglyceride testing on a fasting specimen if clinically indicated.  Yates Decamp et al. J. of Clin. Lipidol. 0350;0:938-182. Marland Kitchen    LDL Cholesterol (Calc) 197 (H) mg/dL (calc)    Comment: LDL-C levels > or = 190 mg/dL may indicate familial  hypercholesterolemia (FH). Clinical assessment and  measurement of blood lipid levels should be  considered for all first degree relatives of  patients with an FH diagnosis.  For questions about testing for  familial hypercholesterolemia, please call Insurance risk surveyor at [email or web address]. Duncan Dull, et al. J National Lipid Association  Recommendations for Patient-Centered Management of  Dyslipidemia: Part 1 Journal of Clinical Lipidology  2015;9(2), 129-169. Reference range: <100 . Desirable range <100 mg/dL for primary prevention;   <70 mg/dL for patients with CHD or diabetic patients  with > or = 2 CHD risk factors. Marland Kitchen LDL-C is now calculated using the Martin-Hopkins  calculation, which is a validated novel method providing  better accuracy than the Friedewald equation in the  estimation of LDL-C.  Cresenciano Genre et al. Annamaria Helling. 9937;169(67): 2061-2068  (http://education.[employer].com/faq/FAQ164)    Total CHOL/HDL Ratio 6.3 (H) <5.0 (calc)   Non-HDL Cholesterol (Calc) 243 (H) <130 mg/dL (calc)    Comment: Non-HDL level > or = 220 is very high and may indicate  genetic familial hypercholesterolemia (FH). Clinical  assessment and measurement of blood lipid levels  should be considered for all first-degree relatives  of patients with an FH diagnosis. . For patients with diabetes plus 1 major ASCVD risk  factor, treating to a non-HDL-C goal of <100 mg/dL  (LDL-C of <70 mg/dL) is considered a therapeutic  option.   TSH     Status: None   Collection Time: 01/03/19 10:47 AM  Result Value Ref Range   TSH 2.88 0.40 - 4.50 mIU/L   The 10-year ASCVD risk score Mikey Bussing DC Jr., et al., 2013) is: 8.4%   Values used to calculate the score:  Age: 63 years     Sex: Female     Is Non-Hispanic African American: Yes     Diabetic: No     Tobacco smoker: No     Systolic Blood Pressure: 115 mmHg     Is BP treated: Yes     HDL Cholesterol: 46 mg/dL     Total Cholesterol: 289 mg/dL   Assessment and Plan: 58 y.o. female with   .Graciana was seen today for follow-up.  Diagnoses and all orders for this visit:  Hypertension goal BP (blood pressure) < 130/80  Mixed  hyperlipidemia -     atorvastatin (LIPITOR) 20 MG tablet; Take 1 tablet (20 mg total) by mouth daily.    HTN BP well controlled Cont Candesartan and Chlorthalidone 10 yr ASCVD risk 8.4%, starting statin therapy Baby asa for primary prevention Counseled on therapeutic lifestyle changes   Patient education and anticipatory guidance given Patient agrees with treatment plan Follow-up in 3 months or sooner as needed if symptoms worsen or fail to improve  Darlyne Russian PA-C

## 2019-02-09 NOTE — Patient Instructions (Signed)
DASH Eating Plan  DASH stands for "Dietary Approaches to Stop Hypertension." The DASH eating plan is a healthy eating plan that has been shown to reduce high blood pressure (hypertension). It may also reduce your risk for type 2 diabetes, heart disease, and stroke. The DASH eating plan may also help with weight loss.  What are tips for following this plan?    General guidelines   Avoid eating more than 2,300 mg (milligrams) of salt (sodium) a day. If you have hypertension, you may need to reduce your sodium intake to 1,500 mg a day.   Limit alcohol intake to no more than 1 drink a day for nonpregnant women and 2 drinks a day for men. One drink equals 12 oz of beer, 5 oz of wine, or 1 oz of hard liquor.   Work with your health care provider to maintain a healthy body weight or to lose weight. Ask what an ideal weight is for you.   Get at least 30 minutes of exercise that causes your heart to beat faster (aerobic exercise) most days of the week. Activities may include walking, swimming, or biking.   Work with your health care provider or diet and nutrition specialist (dietitian) to adjust your eating plan to your individual calorie needs.  Reading food labels     Check food labels for the amount of sodium per serving. Choose foods with less than 5 percent of the Daily Value of sodium. Generally, foods with less than 300 mg of sodium per serving fit into this eating plan.   To find whole grains, look for the word "whole" as the first word in the ingredient list.  Shopping   Buy products labeled as "low-sodium" or "no salt added."   Buy fresh foods. Avoid canned foods and premade or frozen meals.  Cooking   Avoid adding salt when cooking. Use salt-free seasonings or herbs instead of table salt or sea salt. Check with your health care provider or pharmacist before using salt substitutes.   Do not fry foods. Cook foods using healthy methods such as baking, boiling, grilling, and broiling instead.   Cook with  heart-healthy oils, such as olive, canola, soybean, or sunflower oil.  Meal planning   Eat a balanced diet that includes:  ? 5 or more servings of fruits and vegetables each day. At each meal, try to fill half of your plate with fruits and vegetables.  ? Up to 6-8 servings of whole grains each day.  ? Less than 6 oz of lean meat, poultry, or fish each day. A 3-oz serving of meat is about the same size as a deck of cards. One egg equals 1 oz.  ? 2 servings of low-fat dairy each day.  ? A serving of nuts, seeds, or beans 5 times each week.  ? Heart-healthy fats. Healthy fats called Omega-3 fatty acids are found in foods such as flaxseeds and coldwater fish, like sardines, salmon, and mackerel.   Limit how much you eat of the following:  ? Canned or prepackaged foods.  ? Food that is high in trans fat, such as fried foods.  ? Food that is high in saturated fat, such as fatty meat.  ? Sweets, desserts, sugary drinks, and other foods with added sugar.  ? Full-fat dairy products.   Do not salt foods before eating.   Try to eat at least 2 vegetarian meals each week.   Eat more home-cooked food and less restaurant, buffet, and fast food.     When eating at a restaurant, ask that your food be prepared with less salt or no salt, if possible.  What foods are recommended?  The items listed may not be a complete list. Talk with your dietitian about what dietary choices are best for you.  Grains  Whole-grain or whole-wheat bread. Whole-grain or whole-wheat pasta. Brown rice. Oatmeal. Quinoa. Bulgur. Whole-grain and low-sodium cereals. Pita bread. Low-fat, low-sodium crackers. Whole-wheat flour tortillas.  Vegetables  Fresh or frozen vegetables (raw, steamed, roasted, or grilled). Low-sodium or reduced-sodium tomato and vegetable juice. Low-sodium or reduced-sodium tomato sauce and tomato paste. Low-sodium or reduced-sodium canned vegetables.  Fruits  All fresh, dried, or frozen fruit. Canned fruit in natural juice (without  added sugar).  Meat and other protein foods  Skinless chicken or turkey. Ground chicken or turkey. Pork with fat trimmed off. Fish and seafood. Egg whites. Dried beans, peas, or lentils. Unsalted nuts, nut butters, and seeds. Unsalted canned beans. Lean cuts of beef with fat trimmed off. Low-sodium, lean deli meat.  Dairy  Low-fat (1%) or fat-free (skim) milk. Fat-free, low-fat, or reduced-fat cheeses. Nonfat, low-sodium ricotta or cottage cheese. Low-fat or nonfat yogurt. Low-fat, low-sodium cheese.  Fats and oils  Soft margarine without trans fats. Vegetable oil. Low-fat, reduced-fat, or light mayonnaise and salad dressings (reduced-sodium). Canola, safflower, olive, soybean, and sunflower oils. Avocado.  Seasoning and other foods  Herbs. Spices. Seasoning mixes without salt. Unsalted popcorn and pretzels. Fat-free sweets.  What foods are not recommended?  The items listed may not be a complete list. Talk with your dietitian about what dietary choices are best for you.  Grains  Baked goods made with fat, such as croissants, muffins, or some breads. Dry pasta or rice meal packs.  Vegetables  Creamed or fried vegetables. Vegetables in a cheese sauce. Regular canned vegetables (not low-sodium or reduced-sodium). Regular canned tomato sauce and paste (not low-sodium or reduced-sodium). Regular tomato and vegetable juice (not low-sodium or reduced-sodium). Pickles. Olives.  Fruits  Canned fruit in a light or heavy syrup. Fried fruit. Fruit in cream or butter sauce.  Meat and other protein foods  Fatty cuts of meat. Ribs. Fried meat. Bacon. Sausage. Bologna and other processed lunch meats. Salami. Fatback. Hotdogs. Bratwurst. Salted nuts and seeds. Canned beans with added salt. Canned or smoked fish. Whole eggs or egg yolks. Chicken or turkey with skin.  Dairy  Whole or 2% milk, cream, and half-and-half. Whole or full-fat cream cheese. Whole-fat or sweetened yogurt. Full-fat cheese. Nondairy creamers. Whipped toppings.  Processed cheese and cheese spreads.  Fats and oils  Butter. Stick margarine. Lard. Shortening. Ghee. Bacon fat. Tropical oils, such as coconut, palm kernel, or palm oil.  Seasoning and other foods  Salted popcorn and pretzels. Onion salt, garlic salt, seasoned salt, table salt, and sea salt. Worcestershire sauce. Tartar sauce. Barbecue sauce. Teriyaki sauce. Soy sauce, including reduced-sodium. Steak sauce. Canned and packaged gravies. Fish sauce. Oyster sauce. Cocktail sauce. Horseradish that you find on the shelf. Ketchup. Mustard. Meat flavorings and tenderizers. Bouillon cubes. Hot sauce and Tabasco sauce. Premade or packaged marinades. Premade or packaged taco seasonings. Relishes. Regular salad dressings.  Where to find more information:   National Heart, Lung, and Blood Institute: www.nhlbi.nih.gov   American Heart Association: www.heart.org  Summary   The DASH eating plan is a healthy eating plan that has been shown to reduce high blood pressure (hypertension). It may also reduce your risk for type 2 diabetes, heart disease, and stroke.   With the   DASH eating plan, you should limit salt (sodium) intake to 2,300 mg a day. If you have hypertension, you may need to reduce your sodium intake to 1,500 mg a day.   When on the DASH eating plan, aim to eat more fresh fruits and vegetables, whole grains, lean proteins, low-fat dairy, and heart-healthy fats.   Work with your health care provider or diet and nutrition specialist (dietitian) to adjust your eating plan to your individual calorie needs.  This information is not intended to replace advice given to you by your health care provider. Make sure you discuss any questions you have with your health care provider.  Document Released: 07/29/2011 Document Revised: 08/02/2016 Document Reviewed: 08/02/2016  Elsevier Interactive Patient Education  2019 Elsevier Inc.

## 2019-03-09 LAB — HM COLONOSCOPY

## 2019-03-14 ENCOUNTER — Ambulatory Visit: Payer: Managed Care, Other (non HMO)

## 2019-03-14 ENCOUNTER — Ambulatory Visit (INDEPENDENT_AMBULATORY_CARE_PROVIDER_SITE_OTHER): Payer: Managed Care, Other (non HMO)

## 2019-03-14 ENCOUNTER — Other Ambulatory Visit: Payer: Self-pay

## 2019-03-14 DIAGNOSIS — Z1231 Encounter for screening mammogram for malignant neoplasm of breast: Secondary | ICD-10-CM

## 2019-03-17 ENCOUNTER — Other Ambulatory Visit: Payer: Self-pay | Admitting: Physician Assistant

## 2019-03-17 DIAGNOSIS — I10 Essential (primary) hypertension: Secondary | ICD-10-CM

## 2019-03-18 ENCOUNTER — Encounter: Payer: Self-pay | Admitting: Physician Assistant

## 2019-03-20 ENCOUNTER — Encounter: Payer: Self-pay | Admitting: Physician Assistant

## 2019-03-20 DIAGNOSIS — J432 Centrilobular emphysema: Secondary | ICD-10-CM

## 2019-03-20 DIAGNOSIS — R911 Solitary pulmonary nodule: Secondary | ICD-10-CM

## 2019-03-20 DIAGNOSIS — I700 Atherosclerosis of aorta: Secondary | ICD-10-CM

## 2019-03-20 HISTORY — DX: Centrilobular emphysema: J43.2

## 2019-03-20 HISTORY — DX: Atherosclerosis of aorta: I70.0

## 2019-03-20 HISTORY — DX: Solitary pulmonary nodule: R91.1

## 2019-03-21 ENCOUNTER — Other Ambulatory Visit: Payer: Self-pay

## 2019-03-21 DIAGNOSIS — I10 Essential (primary) hypertension: Secondary | ICD-10-CM

## 2019-03-21 MED ORDER — CANDESARTAN CILEXETIL 32 MG PO TABS: 32.0000 mg | ORAL_TABLET | Freq: Every day | ORAL | 0 refills | Status: DC

## 2019-03-21 MED ORDER — CHLORTHALIDONE 25 MG PO TABS: 25.0000 mg | ORAL_TABLET | Freq: Every day | ORAL | 0 refills | Status: DC

## 2019-03-23 ENCOUNTER — Encounter: Payer: Self-pay | Admitting: Physician Assistant

## 2019-05-15 ENCOUNTER — Ambulatory Visit: Payer: Managed Care, Other (non HMO) | Admitting: Physician Assistant

## 2019-07-12 DIAGNOSIS — Z23 Encounter for immunization: Secondary | ICD-10-CM | POA: Diagnosis not present

## 2019-09-06 ENCOUNTER — Telehealth: Payer: Self-pay

## 2019-09-06 NOTE — Telephone Encounter (Signed)
Pt called stating that Nelson Chimes, PA-C wrote a note for her employer on 01/26/2019 regarding wearing a face mask at work. A copy of this letter can be found under chart review. Pt states she has a new job and needs a copy of this letter sent to her new employer. Pt requested a copy to be faxed to Donella Stade at [facility] at fax number [phone]. Fax sent. Pt also aware that a hard copy of this letter is in an envelope at the front desk ready for her to come and pick up.

## 2019-09-14 ENCOUNTER — Other Ambulatory Visit: Payer: Self-pay | Admitting: Physician Assistant

## 2019-09-14 DIAGNOSIS — I10 Essential (primary) hypertension: Secondary | ICD-10-CM

## 2019-09-14 MED ORDER — CHLORTHALIDONE 25 MG PO TABS: 25.0000 mg | ORAL_TABLET | Freq: Every day | ORAL | 0 refills | Status: DC

## 2019-09-14 MED ORDER — CANDESARTAN CILEXETIL 32 MG PO TABS: 32.0000 mg | ORAL_TABLET | Freq: Every day | ORAL | 0 refills | Status: DC

## 2019-09-20 ENCOUNTER — Other Ambulatory Visit: Payer: Self-pay

## 2019-09-20 ENCOUNTER — Ambulatory Visit (INDEPENDENT_AMBULATORY_CARE_PROVIDER_SITE_OTHER): Payer: BC Managed Care – PPO | Admitting: Osteopathic Medicine

## 2019-09-20 ENCOUNTER — Encounter: Payer: Self-pay | Admitting: Osteopathic Medicine

## 2019-09-20 VITALS — BP 113/57 | HR 48 | Temp 97.9°F | Wt 208.0 lb

## 2019-09-20 DIAGNOSIS — R7303 Prediabetes: Secondary | ICD-10-CM | POA: Diagnosis not present

## 2019-09-20 DIAGNOSIS — Z9189 Other specified personal risk factors, not elsewhere classified: Secondary | ICD-10-CM | POA: Diagnosis not present

## 2019-09-20 DIAGNOSIS — N951 Menopausal and female climacteric states: Secondary | ICD-10-CM | POA: Insufficient documentation

## 2019-09-20 DIAGNOSIS — I10 Essential (primary) hypertension: Secondary | ICD-10-CM | POA: Diagnosis not present

## 2019-09-20 DIAGNOSIS — R5382 Chronic fatigue, unspecified: Secondary | ICD-10-CM

## 2019-09-20 DIAGNOSIS — J449 Chronic obstructive pulmonary disease, unspecified: Secondary | ICD-10-CM | POA: Diagnosis not present

## 2019-09-20 LAB — POCT GLYCOSYLATED HEMOGLOBIN (HGB A1C): Hemoglobin A1C: 5.8 % — AB (ref 4.0–5.6)

## 2019-09-20 MED ORDER — ESTRADIOL 0.5 MG PO TABS: 0.5000 mg | ORAL_TABLET | Freq: Every day | ORAL | 3 refills | Status: DC

## 2019-09-20 NOTE — Progress Notes (Signed)
Annette Ayala is a 60 y.o. female who presents to  [place] at [facility]  today, 09/20/19, seeking care for the following:  The primary encounter diagnosis was Prediabetes. Diagnoses of Hypertension goal BP (blood pressure) < 130/80, Chronic obstructive pulmonary disease, unspecified COPD type ([place]), At risk for sleep apnea, Chronic fatigue, and Postmenopausal disorder were also pertinent to this visit.     ASSESSMENT & PLAN with other pertinent history/findings:  1. Prediabetes Stable A1c  2. Hypertension goal BP (blood pressure) < 130/80 BP Readings from Last 3 Encounters:  09/20/19 (!) 113/57  02/09/19 123/73  02/05/19 (!) 150/71   3. Chronic obstructive pulmonary disease, unspecified COPD type ([place]) Stable  4. At risk for sleep apnea 5. Chronic fatigue See stop bang as below, will get home sleep study.  STOP-BANG for SLEEP APNEA Do you Snore loudly? Yes Do you often feel Tired during day? Yes Has anyone Observed you stop breathing? No History of high blood Pressure? Yes BMI >35? Yes Age >50? Yes Neck circumference >16 in? Yes Gender female? No 5-8 = high risk   6.  Postmenopausal disorder Patient inquires about starting estrogen for hot flashes.  Status post hysterectomy.     Orders Placed This Encounter  Procedures  . POCT HgB A1C  . Home sleep test    Meds ordered this encounter  Medications  . estradiol (ESTRACE) 0.5 MG tablet    Sig: Take 1 tablet (0.5 mg total) by mouth daily.    Dispense:  90 tablet    Refill:  3    There are no Patient Instructions on file for this visit.    Follow-up instructions: Return in about 6 months (around 03/19/2020) for [place] (call week prior to visit for lab orders).        BP (!) 113/57 (BP Location: Left Arm, Patient Position: Sitting, Cuff Size: Large)   Pulse (!) 48   Temp 97.9 F (36.6 C) (Oral)   Wt 208 lb (94.3 kg)   BMI 34.61 kg/m   Current  Meds  Medication Sig  . aspirin EC 81 MG tablet Take 1 tablet (81 mg total) by mouth daily.  Marland Kitchen atorvastatin (LIPITOR) 20 MG tablet Take 1 tablet (20 mg total) by mouth daily.  . candesartan (ATACAND) 32 MG tablet Take 1 tablet (32 mg total) by mouth daily.  . chlorthalidone (HYGROTON) 25 MG tablet Take 1 tablet (25 mg total) by mouth daily.  Marland Kitchen OVER THE COUNTER MEDICATION Thrive B vitamins, D3, chromium, selenium, vanadium    Results for orders placed or performed in visit on 09/20/19 (from the past 72 hour(s))  POCT HgB A1C     Status: Abnormal   Collection Time: 09/20/19  9:08 AM  Result Value Ref Range   Hemoglobin A1C 5.8 (A) 4.0 - 5.6 %   HbA1c POC (<> result, manual entry)     HbA1c, POC (prediabetic range)     HbA1c, POC (controlled diabetic range)      No results found.  Depression screen [facility] 2/9 09/20/2019 01/26/2019  Decreased Interest 0 3  Down, Depressed, Hopeless 0 0  PHQ - 2 Score 0 3  Altered sleeping 2 2  Tired, decreased energy 2 2  Change in appetite 0 2  Feeling bad or failure about yourself  2 2  Trouble concentrating 0 0  Moving slowly or fidgety/restless 0 0  Suicidal thoughts 0 0  PHQ-9 Score 6 11  Difficult doing work/chores  Somewhat difficult -    GAD 7 : Generalized Anxiety Score 09/20/2019 01/26/2019  Nervous, Anxious, on Edge 0 0  Control/stop worrying 0 2  Worry too much - different things 0 2  Trouble relaxing 1 2  Restless 0 0  Easily annoyed or irritable 0 2  Afraid - awful might happen 0 0  Total GAD 7 Score 1 8      All questions at time of visit were answered - patient instructed to contact office with any additional concerns or updates.  ER/RTC precautions were reviewed with the patient.  Please note: voice recognition software was used to produce this document, and typos may escape review. Please contact Dr. Sheppard Coil for any needed clarifications.

## 2019-10-03 ENCOUNTER — Telehealth: Payer: Self-pay

## 2019-10-03 NOTE — Telephone Encounter (Signed)
Hair loss can be a side effect in 7% of people  Can try cutting dose in half

## 2019-10-03 NOTE — Telephone Encounter (Signed)
Pt left a vm msg stating she is having hair loss since taking estradiol. She wants to know if this is a side effect of the med. Is there an alternative rx she can take? Pls advise, thanks.

## 2019-10-04 NOTE — Telephone Encounter (Signed)
Chlorthalidone seems unlikely to be the issue if hair loss a new problem since estrogen  Any further concerns on this matter will require a virtual visit to discuss

## 2019-10-04 NOTE — Telephone Encounter (Signed)
Pt has been updated regarding provider's msg. She also has concern with taking chlorthalidone rx. She mentioned this med causes hair loss as well. Requesting feed back whether she should continue taking the rx or will provider send in an alternative? She continues to have hair loss.

## 2019-10-05 NOTE — Telephone Encounter (Signed)
Pt advised.

## 2019-11-30 ENCOUNTER — Other Ambulatory Visit: Payer: Self-pay

## 2019-11-30 ENCOUNTER — Ambulatory Visit (HOSPITAL_BASED_OUTPATIENT_CLINIC_OR_DEPARTMENT_OTHER): Payer: Self-pay | Attending: Osteopathic Medicine | Admitting: Internal Medicine

## 2019-11-30 DIAGNOSIS — I10 Essential (primary) hypertension: Secondary | ICD-10-CM | POA: Insufficient documentation

## 2019-11-30 DIAGNOSIS — Z9189 Other specified personal risk factors, not elsewhere classified: Secondary | ICD-10-CM | POA: Insufficient documentation

## 2019-11-30 DIAGNOSIS — R7303 Prediabetes: Secondary | ICD-10-CM | POA: Insufficient documentation

## 2019-11-30 DIAGNOSIS — R5382 Chronic fatigue, unspecified: Secondary | ICD-10-CM | POA: Insufficient documentation

## 2019-11-30 DIAGNOSIS — G4733 Obstructive sleep apnea (adult) (pediatric): Secondary | ICD-10-CM

## 2019-11-30 DIAGNOSIS — J449 Chronic obstructive pulmonary disease, unspecified: Secondary | ICD-10-CM | POA: Insufficient documentation

## 2019-12-15 DIAGNOSIS — J449 Chronic obstructive pulmonary disease, unspecified: Secondary | ICD-10-CM

## 2019-12-15 DIAGNOSIS — I10 Essential (primary) hypertension: Secondary | ICD-10-CM

## 2019-12-15 DIAGNOSIS — Z9189 Other specified personal risk factors, not elsewhere classified: Secondary | ICD-10-CM

## 2019-12-15 DIAGNOSIS — R7303 Prediabetes: Secondary | ICD-10-CM

## 2019-12-15 DIAGNOSIS — R5382 Chronic fatigue, unspecified: Secondary | ICD-10-CM

## 2019-12-15 NOTE — Procedures (Signed)
    Patient Name: Annette Ayala, Annette Ayala Date: 11/30/2019 Gender: Female D.O.B: 1961/05/07 Age (years): 58 Referring Provider: Emeterio Reeve Height (inches): 32 Interpreting Physician: Baird Lyons MD, ABSM Weight (lbs): 193 RPSGT: Jacolyn Reedy BMI: 32 MRN: [id number] Neck Size: 14.50  CLINICAL INFORMATION Sleep Study Type: HST Indication for sleep study: OSA Epworth Sleepiness Score: 9  SLEEP STUDY TECHNIQUE A multi-channel overnight portable sleep study was performed. The channels recorded were: nasal airflow, thoracic respiratory movement, and oxygen saturation with a pulse oximetry. Snoring was also monitored.  MEDICATIONS Patient self administered medications include: none reported.  SLEEP ARCHITECTURE Patient was studied for 555.5 minutes. The sleep efficiency was 97.1 % and the patient was supine for 17.9%. The arousal index was 0.0 per hour.  RESPIRATORY PARAMETERS The overall AHI was 6.6 per hour, with a central apnea index of 0.0 per hour. The oxygen nadir was 88% during sleep.  CARDIAC DATA Mean heart rate during sleep was 50.5 bpm.  IMPRESSIONS - Mild obstructive sleep apnea occurred during this study (AHI = 6.6/h). - No significant central sleep apnea occurred during this study (CAI = 0.0/h). - Mild oxygen desaturation was noted during this study (Min O2 = 88%). Mean sat 94%. - Patient snored.  DIAGNOSIS - Obstructive Sleep Apnea (327.23 [G47.33 ICD-10])  RECOMMENDATIONS - Treatment for mild OSA is directed at symptoms. Conservative measures may include observation. weight loss and sleep position off back. - Other options suchas CPAP, a fitted oral appliance, or ENT evaluation, would be based on clinical judgment. - Be careful with alcohol, sedatives and other CNS depressants that may worsen sleep apnea and disrupt normal sleep architecture. - Sleep hygiene should be reviewed to assess factors that may improve sleep quality. - Weight management  and regular exercise should be initiated or continued.  [Electronically signed] 12/15/2019 11:39 AM  Baird Lyons MD, ABSM Diplomate, American Board of Sleep Medicine   NPI: [id number]                         [place], [place] [facility]  ELECTRONICALLY SIGNED ON:  12/15/2019, 11:40 AM [place] PH: [phone]   FX: [phone] [place]

## 2019-12-17 ENCOUNTER — Encounter: Payer: Self-pay | Admitting: Osteopathic Medicine

## 2019-12-18 ENCOUNTER — Telehealth: Payer: Self-pay

## 2019-12-18 ENCOUNTER — Other Ambulatory Visit: Payer: Self-pay

## 2019-12-18 DIAGNOSIS — I10 Essential (primary) hypertension: Secondary | ICD-10-CM

## 2019-12-18 MED ORDER — CHLORTHALIDONE 25 MG PO TABS: 25.0000 mg | ORAL_TABLET | Freq: Every day | ORAL | 3 refills | Status: DC

## 2019-12-18 MED ORDER — LOSARTAN POTASSIUM 100 MG PO TABS: 100.0000 mg | ORAL_TABLET | Freq: Every day | ORAL | 3 refills | Status: DC

## 2019-12-18 NOTE — Telephone Encounter (Signed)
Losartan prescription sent to pharmacy on file

## 2019-12-18 NOTE — Telephone Encounter (Signed)
Pt left a vm msg yesterday stating that candesartan rx is too expensive. Pt currently does not have any insurance coverage. Requesting an alternative rx. Pls advise.

## 2019-12-19 NOTE — Telephone Encounter (Signed)
Patient advised.

## 2020-01-17 IMAGING — US VENOUS DOPPLER ULTRASOUND OF  LOWER EXTREMITIES
1 series · 13 of 24 positions shown · non-contrast
Comparison: None

CLINICAL DATA: Bilateral leg swelling, pain x6 months, right lower
leg ulceration, previous tobacco abuse

EXAM:
BILATERAL LOWER EXTREMITY VENOUS DOPPLER ULTRASOUND
TECHNIQUE: Gray-scale sonography with compression, as well as color and duplex
ultrasound, were performed to evaluate the deep venous system from
the level of the common femoral vein through the popliteal and
proximal calf veins.

[Series 1: venous doppler ultrasound of lower extremities · 0.05mm/px · 13 of 59 slices shown]
[im 1/59]
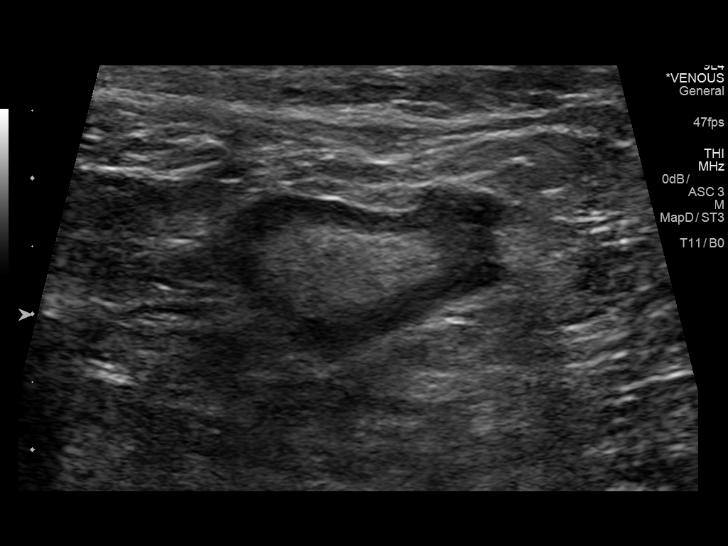
[im 6/59]
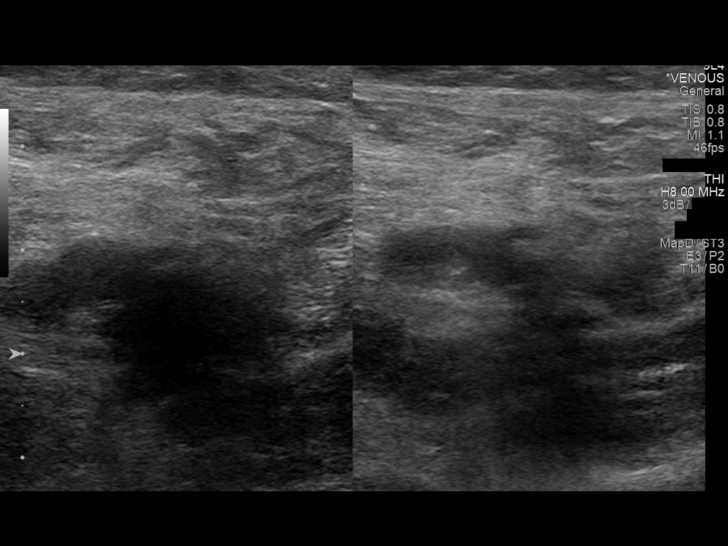
[im 11/59]
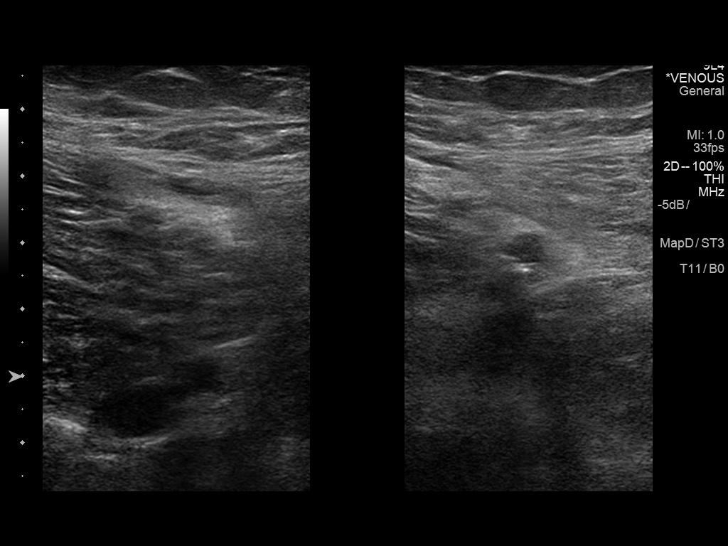
[im 16/59]
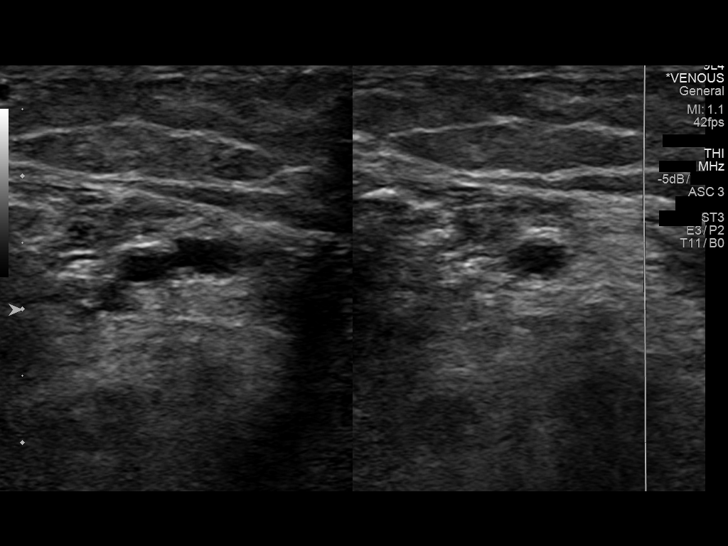
[im 21/59]
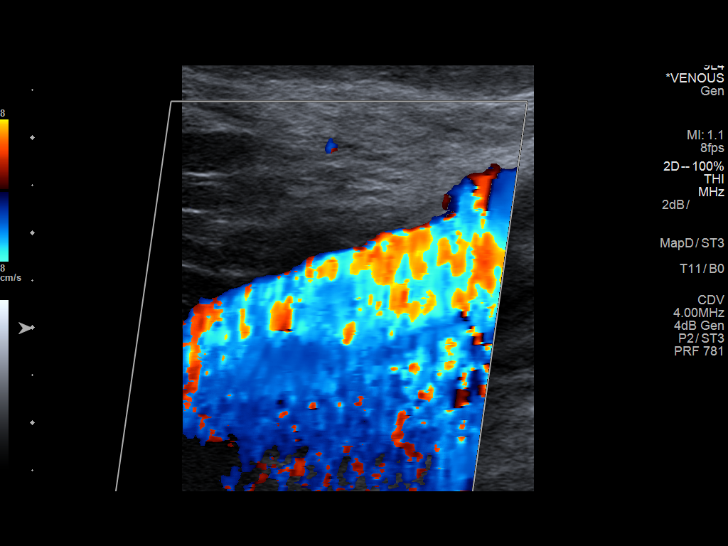
[im 26/59]
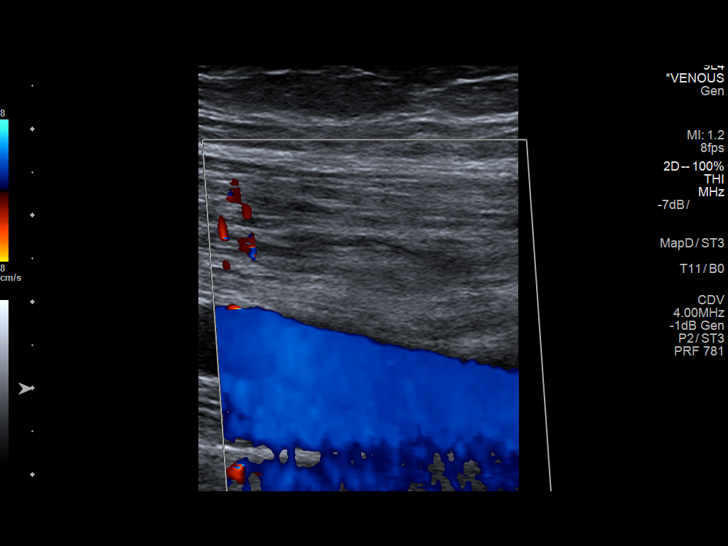
[im 31/59]
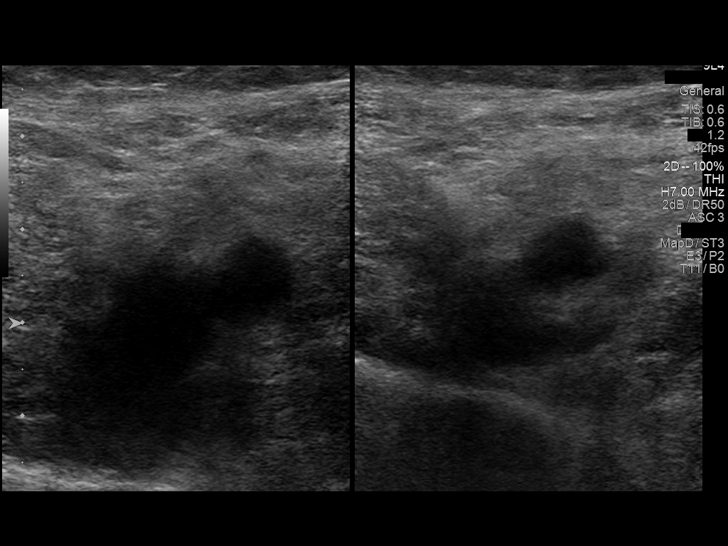
[im 33/59]
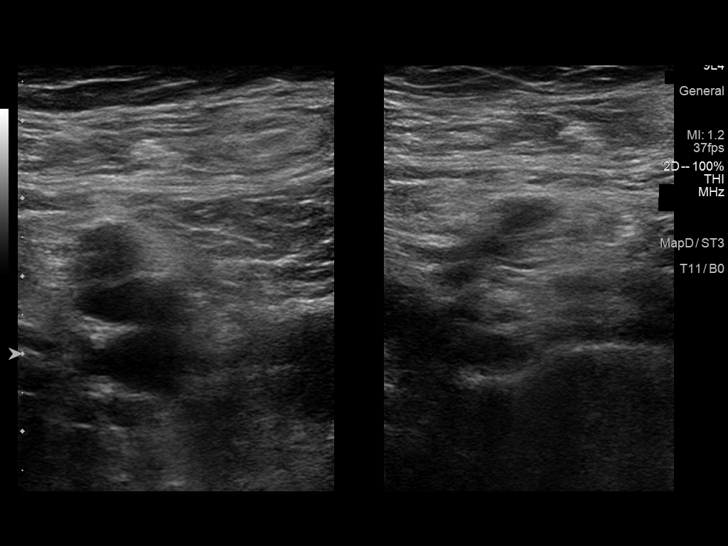
[im 38/59]
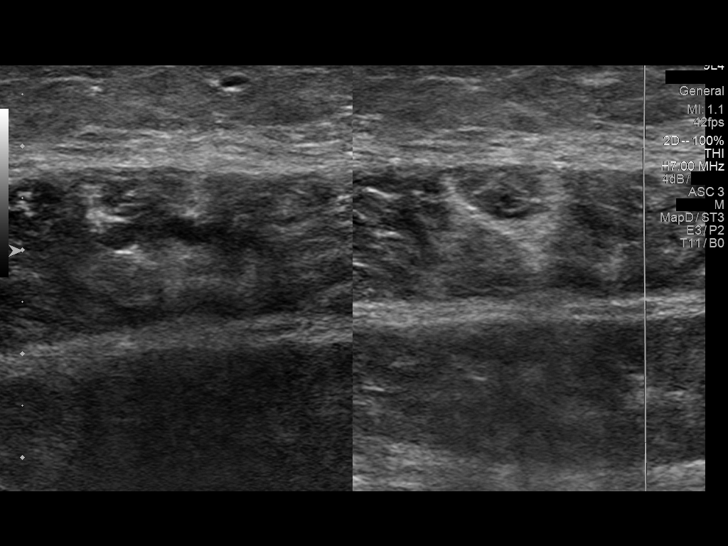
[im 43/59]
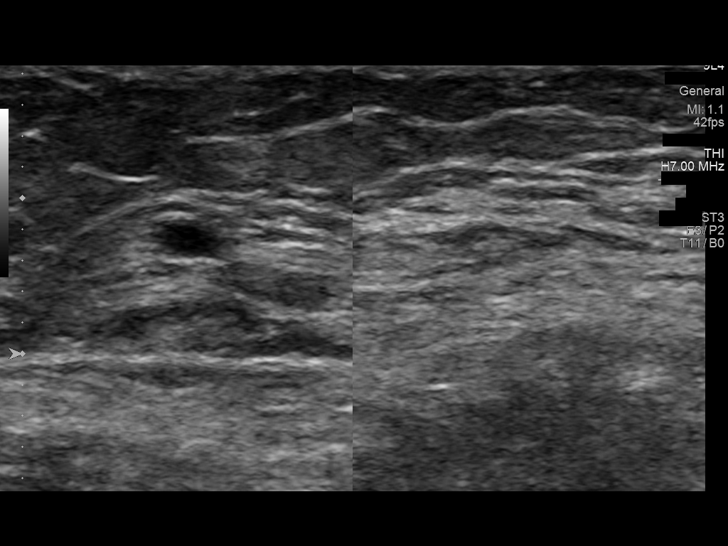
[im 48/59]
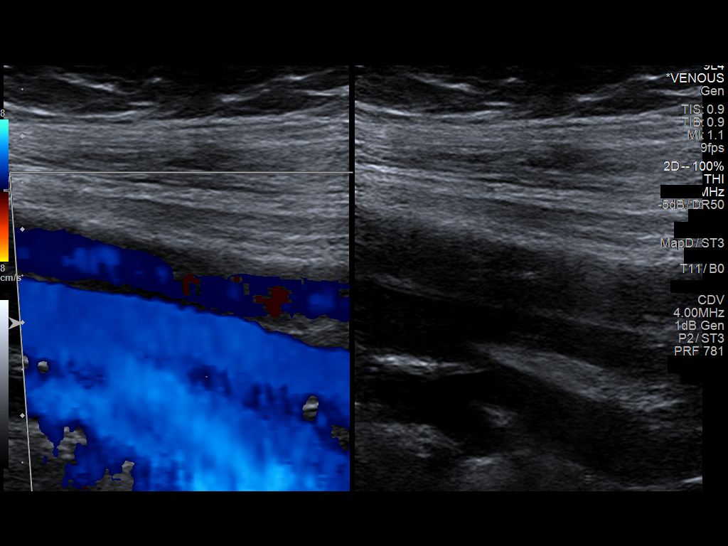
[im 53/59]
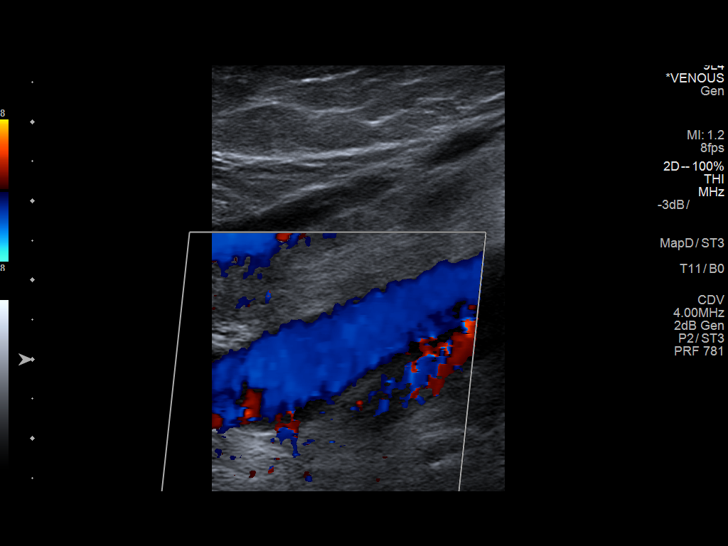
[im 59/59]
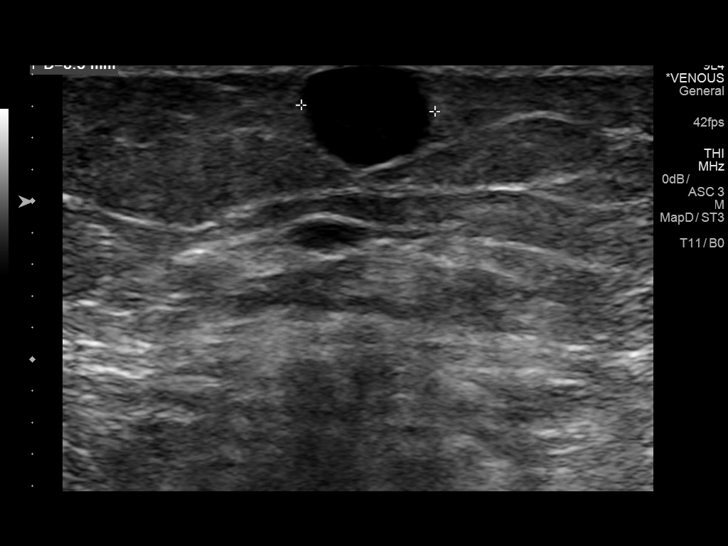

[13 of 24 positions shown; findings below may reference images not displayed]

FINDINGS: Normal compressibility of the common femoral, superficial femoral,
and popliteal veins, as well as the proximal calf veins. No filling
defects to suggest DVT on grayscale or color Doppler imaging.
Doppler waveforms show normal direction of venous flow, normal
respiratory phasicity and response to augmentation.

Visualized segments of the saphenous venous system normal in caliber
and compressibility.

0.9 cm subcutaneous unilocular cystic lesion inferomedial to the
left knee.

Morphologically unremarkable left inguinal lymph node, 1.2 cm short
axis diameter.
IMPRESSION: No femoropopliteal and no calf DVT in the visualized calf veins. If
clinical symptoms are inconsistent or if there are persistent or
worsening symptoms, further imaging (possibly involving the iliac
veins) may be warranted.

## 2020-02-11 ENCOUNTER — Other Ambulatory Visit: Payer: Self-pay

## 2020-02-11 DIAGNOSIS — E782 Mixed hyperlipidemia: Secondary | ICD-10-CM

## 2020-02-11 MED ORDER — ESTRADIOL 0.5 MG PO TABS: 0.5000 mg | ORAL_TABLET | Freq: Every day | ORAL | 0 refills | Status: DC

## 2020-02-11 MED ORDER — ATORVASTATIN CALCIUM 20 MG PO TABS: 20.0000 mg | ORAL_TABLET | Freq: Every day | ORAL | 0 refills | Status: DC

## 2020-02-13 ENCOUNTER — Other Ambulatory Visit: Payer: Self-pay | Admitting: Physician Assistant

## 2020-02-13 DIAGNOSIS — E782 Mixed hyperlipidemia: Secondary | ICD-10-CM

## 2020-02-27 ENCOUNTER — Other Ambulatory Visit: Payer: Self-pay

## 2020-02-27 ENCOUNTER — Encounter: Payer: Self-pay | Admitting: Osteopathic Medicine

## 2020-02-27 DIAGNOSIS — I10 Essential (primary) hypertension: Secondary | ICD-10-CM

## 2020-02-27 MED ORDER — CHLORTHALIDONE 25 MG PO TABS: 25.0000 mg | ORAL_TABLET | Freq: Every day | ORAL | 1 refills | Status: DC

## 2020-03-19 ENCOUNTER — Ambulatory Visit (INDEPENDENT_AMBULATORY_CARE_PROVIDER_SITE_OTHER): Payer: 59 | Admitting: Osteopathic Medicine

## 2020-03-19 ENCOUNTER — Telehealth: Payer: Self-pay | Admitting: Osteopathic Medicine

## 2020-03-19 ENCOUNTER — Encounter: Payer: Self-pay | Admitting: Osteopathic Medicine

## 2020-03-19 VITALS — BP 136/71 | HR 45 | Temp 97.0°F | Ht 65.0 in | Wt 208.0 lb

## 2020-03-19 DIAGNOSIS — I10 Essential (primary) hypertension: Secondary | ICD-10-CM

## 2020-03-19 DIAGNOSIS — R7303 Prediabetes: Secondary | ICD-10-CM | POA: Diagnosis not present

## 2020-03-19 DIAGNOSIS — N951 Menopausal and female climacteric states: Secondary | ICD-10-CM | POA: Diagnosis not present

## 2020-03-19 DIAGNOSIS — R5382 Chronic fatigue, unspecified: Secondary | ICD-10-CM

## 2020-03-19 DIAGNOSIS — E782 Mixed hyperlipidemia: Secondary | ICD-10-CM

## 2020-03-19 DIAGNOSIS — G8929 Other chronic pain: Secondary | ICD-10-CM

## 2020-03-19 DIAGNOSIS — M25561 Pain in right knee: Secondary | ICD-10-CM | POA: Diagnosis not present

## 2020-03-19 MED ORDER — ESTRADIOL 0.5 MG PO TABS: 1.0000 mg | ORAL_TABLET | Freq: Every day | ORAL | 0 refills | Status: DC

## 2020-03-19 MED ORDER — MELOXICAM 15 MG PO TABS
15.0000 mg | ORAL_TABLET | Freq: Every day | ORAL | 0 refills | Status: DC
Start: 2020-03-19 — End: 2020-04-15

## 2020-03-19 MED ORDER — AMBULATORY NON FORMULARY MEDICATION: 99 refills | Status: DC

## 2020-03-19 MED ORDER — LOSARTAN POTASSIUM 50 MG PO TABS
50.0000 mg | ORAL_TABLET | Freq: Every day | ORAL | 1 refills | Status: DC
Start: 2020-03-19 — End: 2020-09-15

## 2020-03-19 NOTE — Telephone Encounter (Signed)
Sleep study completed 11/30/2019 Needs CPAP  See Rx printed  Thanks!

## 2020-03-19 NOTE — Patient Instructions (Addendum)
1. Chronic pain of right knee Xray ordered, can do this tomorrow  See printed exercises I sent prescription for Meloxicam to try instead of Ibuprofen   2. Hypertension goal BP (blood pressure) < 130/80 Start the Losartan - I sent Rx to [facility]   3. Postmenopausal disorder Increase Estrogen from taking one pill daily (0.5 mg) to 2 pills daily (1 mg)   4. Prediabetes 5. Mixed hyperlipidemia 6. Chronic fatigue Labs ordered - can do these tomorrow when you're here anyway for Xray

## 2020-03-19 NOTE — Progress Notes (Signed)
Annette Ayala is a 59 y.o. female who presents to  [place] at [facility]  today, 03/19/20, seeking care for the following:  . Knee pain - will see Dr T  . Hot flashes, mood swings . Prediabetes - due for labs, last A1C 6 mos ago was 5.8 . HTN - hasn't started the losartan, was tooex pensive at previous pharmacy, systolic BP above goal, No CP/SOB. On exam WNL S1S2 and RRR, lungs CTAB . Hasnt' heard back about sleep apnea supplies   . Still fatigued       ASSESSMENT & PLAN with other pertinent findings:  The primary encounter diagnosis was Chronic pain of right knee. Diagnoses of Hypertension goal BP (blood pressure) < 130/80, Postmenopausal disorder, Prediabetes, Mixed hyperlipidemia, and Chronic fatigue were also pertinent to this visit.     Patient Instructions  1. Chronic pain of right knee Xray ordered, can do this tomorrow  See printed exercises I sent prescription for Meloxicam to try instead of Ibuprofen   2. Hypertension goal BP (blood pressure) < 130/80 Start the Losartan - I sent Rx to [facility]   3. Postmenopausal disorder Increase Estrogen from taking one pill daily (0.5 mg) to 2 pills daily (1 mg)   4. Prediabetes 5. Mixed hyperlipidemia 6. Chronic fatigue Labs ordered - can do these tomorrow when you're here anyway for Xray          Orders Placed This Encounter  Procedures  . DG Knee Complete 4 Views Right  . CBC  . COMPLETE METABOLIC PANEL WITH GFR  . Lipid panel  . Hemoglobin A1c  . TSH    Meds ordered this encounter  Medications  . meloxicam (MOBIC) 15 MG tablet    Sig: Take 1 tablet (15 mg total) by mouth daily.    Dispense:  30 tablet    Refill:  0  . estradiol (ESTRACE) 0.5 MG tablet    Sig: Take 2 tablets (1 mg total) by mouth daily.    Dispense:  90 tablet    Refill:  0  . losartan (COZAAR) 50 MG tablet    Sig: Take 1 tablet (50 mg total) by mouth daily.    Dispense:  90 tablet     Refill:  1       Follow-up instructions: Return in about 4 weeks (around 04/16/2020) for BP RECHECK W/ DR A, SEPARATE APPT W/ DR T IF NEEDED FOR KNEE PAIN.                                         BP (!) 136/71 (BP Location: Left Arm, Patient Position: Sitting)   Pulse 45   Temp (!) 97 F (36.1 C)   Ht 5\' 5"  (1.651 m)   Wt (!) 208 lb (94.3 kg)   SpO2 97%   BMI 34.61 kg/m   Current Meds  Medication Sig  . aspirin EC 81 MG tablet Take 1 tablet (81 mg total) by mouth daily.  Marland Kitchen atorvastatin (LIPITOR) 20 MG tablet Take 1 tablet (20 mg total) by mouth daily.  . chlorthalidone (HYGROTON) 25 MG tablet Take 1 tablet (25 mg total) by mouth daily.  Marland Kitchen estradiol (ESTRACE) 0.5 MG tablet Take 2 tablets (1 mg total) by mouth daily.  Marland Kitchen OVER THE COUNTER MEDICATION Thrive B vitamins, D3, chromium, selenium, vanadium  . [DISCONTINUED] estradiol (ESTRACE) 0.5  MG tablet Take 1 tablet (0.5 mg total) by mouth daily.    No results found for this or any previous visit (from the past 72 hour(s)).  No results found.     All questions at time of visit were answered - patient instructed to contact office with any additional concerns or updates.  ER/RTC precautions were reviewed with the patient as applicable.   Please note: voice recognition software was used to produce this document, and typos may escape review. Please contact Dr. Sheppard Coil for any needed clarifications.   Total encounter time: 30 minutes.

## 2020-03-20 ENCOUNTER — Ambulatory Visit (INDEPENDENT_AMBULATORY_CARE_PROVIDER_SITE_OTHER): Payer: 59

## 2020-03-20 DIAGNOSIS — G8929 Other chronic pain: Secondary | ICD-10-CM | POA: Diagnosis not present

## 2020-03-20 DIAGNOSIS — M25561 Pain in right knee: Secondary | ICD-10-CM

## 2020-03-21 ENCOUNTER — Encounter: Payer: Self-pay | Admitting: Osteopathic Medicine

## 2020-03-21 LAB — COMPLETE METABOLIC PANEL WITH GFR
AG Ratio: 1.4 (calc) (ref 1.0–2.5)
ALT: 14 U/L (ref 6–29)
AST: 16 U/L (ref 10–35)
Albumin: 4.2 g/dL (ref 3.6–5.1)
Alkaline phosphatase (APISO): 59 U/L (ref 37–153)
BUN: 14 mg/dL (ref 7–25)
CO2: 30 mmol/L (ref 20–32)
Calcium: 9.2 mg/dL (ref 8.6–10.4)
Chloride: 100 mmol/L (ref 98–110)
Creat: 0.72 mg/dL (ref 0.50–1.05)
GFR, Est African American: 106 mL/min/{1.73_m2} (ref >=60)
GFR, Est Non African American: 92 mL/min/{1.73_m2} (ref >=60)
Globulin: 3 g/dL (calc) (ref 1.9–3.7)
Glucose, Bld: 98 mg/dL (ref 65–139)
Potassium: 3.7 mmol/L (ref 3.5–5.3)
Sodium: 139 mmol/L (ref 135–146)
Total Bilirubin: 0.6 mg/dL (ref 0.2–1.2)
Total Protein: 7.2 g/dL (ref 6.1–8.1)

## 2020-03-21 LAB — LIPID PANEL
Cholesterol: 190 mg/dL (ref <200)
HDL: 50 mg/dL (ref >=50)
LDL Cholesterol (Calc): 119 mg/dL (calc) — ABNORMAL HIGH
Non-HDL Cholesterol (Calc): 140 mg/dL (calc) — ABNORMAL HIGH (ref <130)
Total CHOL/HDL Ratio: 3.8 (calc) (ref <5.0)
Triglycerides: 100 mg/dL (ref <150)

## 2020-03-21 LAB — HEMOGLOBIN A1C
Hgb A1c MFr Bld: 5.8 % of total Hgb — ABNORMAL HIGH (ref <5.7)
Mean Plasma Glucose: 120 (calc)
eAG (mmol/L): 6.6 (calc)

## 2020-03-21 LAB — CBC
HCT: 39.9 % (ref 35.0–45.0)
Hemoglobin: 13.3 g/dL (ref 11.7–15.5)
MCH: 30.1 pg (ref 27.0–33.0)
MCHC: 33.3 g/dL (ref 32.0–36.0)
MCV: 90.3 fL (ref 80.0–100.0)
MPV: 9.8 fL (ref 7.5–12.5)
Platelets: 278 10*3/uL (ref 140–400)
RBC: 4.42 10*6/uL (ref 3.80–5.10)
RDW: 12.7 % (ref 11.0–15.0)
WBC: 7.6 10*3/uL (ref 3.8–10.8)

## 2020-03-21 LAB — TSH: TSH: 2.73 mIU/L (ref 0.40–4.50)

## 2020-03-21 LAB — SPECIMEN COMPROMISED

## 2020-03-24 NOTE — Telephone Encounter (Signed)
Patient sent 4 MyChart msg over weekend asking about results.   Can you please review results and send pt note via MyChart?  Thanks!  Note sent to Annette Ayala, covering provider

## 2020-03-26 NOTE — Telephone Encounter (Signed)
Task completed, I  haven't had a chance to document. Thanks

## 2020-03-26 NOTE — Telephone Encounter (Signed)
Has this been completed for the patient? Thanks.

## 2020-04-15 ENCOUNTER — Encounter: Payer: Self-pay | Admitting: Osteopathic Medicine

## 2020-04-15 ENCOUNTER — Other Ambulatory Visit: Payer: Self-pay | Admitting: Osteopathic Medicine

## 2020-04-15 ENCOUNTER — Ambulatory Visit (INDEPENDENT_AMBULATORY_CARE_PROVIDER_SITE_OTHER): Payer: 59 | Admitting: Osteopathic Medicine

## 2020-04-15 ENCOUNTER — Other Ambulatory Visit: Payer: Self-pay

## 2020-04-15 VITALS — BP 129/56 | HR 50 | Wt 207.0 lb

## 2020-04-15 DIAGNOSIS — Z23 Encounter for immunization: Secondary | ICD-10-CM | POA: Diagnosis not present

## 2020-04-15 DIAGNOSIS — M25512 Pain in left shoulder: Secondary | ICD-10-CM | POA: Diagnosis not present

## 2020-04-15 DIAGNOSIS — I10 Essential (primary) hypertension: Secondary | ICD-10-CM | POA: Diagnosis not present

## 2020-04-15 MED ORDER — MELOXICAM 15 MG PO TABS: 15.0000 mg | ORAL_TABLET | Freq: Every day | ORAL | 1 refills | Status: DC

## 2020-04-15 NOTE — Progress Notes (Signed)
Annette Ayala is a 59 y.o. female who presents to  [place] at [facility]  today, 04/15/20, seeking care for the following:  BP check  Taking losartan 50 mg daily and chlorthalidone 25 mg daily. Pulse is typically low, at baseline today  BP Readings from Last 3 Encounters:  04/15/20 (!) 129/56  03/19/20 (!) 136/71  09/20/19 (!) 113/57    L arm/shoulder pain - few days soreness in shoulder/deltoid area, thinks d/t MSK strain from new repetitive motions at her job, doing a lot more lifting recently. No CP/SOB. Requests refill on Mobic     ASSESSMENT & PLAN with other pertinent findings:  The primary encounter diagnosis was Essential hypertension. A diagnosis of Acute pain of left shoulder was also pertinent to this visit.   BP at goal Bradycardia, stable no symptoms  Shoulder rehab exercises/stretches and sports med f/u prn otherwise see me 6 mos    There are no Patient Instructions on file for this visit.  No orders of the defined types were placed in this encounter.   Meds ordered this encounter  Medications  . meloxicam (MOBIC) 15 MG tablet    Sig: Take 1 tablet (15 mg total) by mouth daily.    Dispense:  90 tablet    Refill:  1       Follow-up instructions: Return in about 6 months (around 10/16/2020) for ANNUAL (call week prior to visit for lab orders) - LIPID, CBC, CMP, A1C .                                         BP (!) 129/56   Pulse (!) 50   Wt 207 lb (93.9 kg)   SpO2 100%   BMI 34.45 kg/m   Current Meds  Medication Sig  . AMBULATORY NON FORMULARY MEDICATION Supply ordered: CPAP and other supplies needed (headgear, cushions, filters, heated tuubing and water chamber) Dx: obstructive sleep apnea Settings: auto-titration 5-20 cmH2O  . aspirin EC 81 MG tablet Take 1 tablet (81 mg total) by mouth daily.  Marland Kitchen atorvastatin (LIPITOR) 20 MG tablet Take 1 tablet (20 mg total)  by mouth daily.  . chlorthalidone (HYGROTON) 25 MG tablet Take 1 tablet (25 mg total) by mouth daily.  Marland Kitchen estradiol (ESTRACE) 0.5 MG tablet Take 2 tablets (1 mg total) by mouth daily.  Marland Kitchen losartan (COZAAR) 50 MG tablet Take 1 tablet (50 mg total) by mouth daily.  . meloxicam (MOBIC) 15 MG tablet Take 1 tablet (15 mg total) by mouth daily.  Marland Kitchen OVER THE COUNTER MEDICATION Thrive B vitamins, D3, chromium, selenium, vanadium  . [DISCONTINUED] meloxicam (MOBIC) 15 MG tablet Take 1 tablet (15 mg total) by mouth daily.    No results found for this or any previous visit (from the past 72 hour(s)).  No results found.     All questions at time of visit were answered - patient instructed to contact office with any additional concerns or updates.  ER/RTC precautions were reviewed with the patient as applicable.   Please note: voice recognition software was used to produce this document, and typos may escape review. Please contact Dr. Sheppard Coil for any needed clarifications.   Total encounter time: 20 minutes.

## 2020-04-16 ENCOUNTER — Other Ambulatory Visit: Payer: Self-pay | Admitting: Osteopathic Medicine

## 2020-05-16 ENCOUNTER — Other Ambulatory Visit: Payer: Self-pay | Admitting: Osteopathic Medicine

## 2020-05-16 DIAGNOSIS — E782 Mixed hyperlipidemia: Secondary | ICD-10-CM

## 2020-06-25 ENCOUNTER — Encounter: Payer: Self-pay | Admitting: Osteopathic Medicine

## 2020-06-27 NOTE — Telephone Encounter (Signed)
Appointment has been made. No further questions at this time.  

## 2020-07-12 ENCOUNTER — Other Ambulatory Visit: Payer: Self-pay | Admitting: Osteopathic Medicine

## 2020-07-23 ENCOUNTER — Ambulatory Visit (INDEPENDENT_AMBULATORY_CARE_PROVIDER_SITE_OTHER): Payer: 59 | Admitting: Osteopathic Medicine

## 2020-07-23 ENCOUNTER — Other Ambulatory Visit: Payer: Self-pay

## 2020-07-23 VITALS — BP 132/79 | HR 53 | Temp 98.1°F | Wt 208.0 lb

## 2020-07-23 DIAGNOSIS — G4730 Sleep apnea, unspecified: Secondary | ICD-10-CM | POA: Diagnosis not present

## 2020-07-23 DIAGNOSIS — Z23 Encounter for immunization: Secondary | ICD-10-CM | POA: Diagnosis not present

## 2020-07-23 NOTE — Progress Notes (Signed)
Annette Ayala is a 59 y.o. female who presents to  [place] at [facility]  today, 07/23/20, seeking care for the following:  . CPAP concerns - Pt reports wheezing since starting CPAP few months ago. Sleep study done d/t snoring concerns, was (+)mild OSA. Pt describes "wheezing sounds in throat & chest" throughout night and day since starting CPAP, some days worse than others, No cough, no SOB. Normal lung and CV exam today . Marland Kitchen Requesting information on COVID booster.     ASSESSMENT & PLAN with other pertinent findings:  The primary encounter diagnosis was Mild sleep apnea. A diagnosis of Need for COVID-19 vaccine was also pertinent to this visit.   1. Mild sleep apnea Ok to stop CPAP If better, nothing else to o, see me when due for annual If not better, needs PFT in office   2. Need for COVID-19 vaccine Booster scheduled at [facility] this week, info printed for patient and handed to her     No results found for this or any previous visit (from the past 24 hour(s)).   Immunization History  Administered Date(s) Administered  . Influenza Inj Mdck Quad Pf 06/02/2018  . Influenza,inj,Quad PF,6+ Mos 04/15/2020  . Influenza,inj,quad, With Preservative 10/27/2016  . Influenza-Unspecified 07/04/2019  . PFIZER SARS-COV-2 Vaccination 11/03/2019, 12/01/2019  . PPD Test 10/27/2016  . Td 03/24/2014     There are no Patient Instructions on file for this visit.  No orders of the defined types were placed in this encounter.   No orders of the defined types were placed in this encounter.      Follow-up instructions: Return in about 1 week (around 07/30/2020) for FOLLOW UP VIA PHONE CALL (SET TO [place] - SEE REMINDERS) .FOLLOW-UP AS PER A/P ABOVE.                                          BP 132/79 (BP Location: Left Arm, Patient Position: Sitting, Cuff Size: Large)   Pulse (!) 53   Temp 98.1  F (36.7 C) (Oral)   Wt 208 lb 0.6 oz (94.4 kg)   SpO2 100%   BMI 34.62 kg/m   Current Meds  Medication Sig  . AMBULATORY NON FORMULARY MEDICATION Supply ordered: CPAP and other supplies needed (headgear, cushions, filters, heated tuubing and water chamber) Dx: obstructive sleep apnea Settings: auto-titration 5-20 cmH2O  . aspirin EC 81 MG tablet Take 1 tablet (81 mg total) by mouth daily.  Marland Kitchen atorvastatin (LIPITOR) 20 MG tablet TAKE ONE TABLET BY MOUTH ONE TIME DAILY  . chlorthalidone (HYGROTON) 25 MG tablet Take 1 tablet (25 mg total) by mouth daily.  Marland Kitchen estradiol (ESTRACE) 0.5 MG tablet TAKE TWO TABLETS BY MOUTH ONE TIME DAILY  . losartan (COZAAR) 50 MG tablet Take 1 tablet (50 mg total) by mouth daily.  . meloxicam (MOBIC) 15 MG tablet Take 1 tablet (15 mg total) by mouth daily.  Marland Kitchen OVER THE COUNTER MEDICATION Thrive B vitamins, D3, chromium, selenium, vanadium    No results found for this or any previous visit (from the past 25 hour(s)).  No results found.     All questions at time of visit were answered - patient instructed to contact office with any additional concerns or updates.  ER/RTC precautions were reviewed with the patient as applicable.   Please note: voice recognition software  was used to produce this document, and typos may escape review. Please contact Dr. Sheppard Coil for any needed clarifications.   TOTAL TIME SPENT CORRDINATING CARE AND ASSESSING/EVALUATING AND TREATING COMPLAINTS 20 MINS

## 2020-08-06 ENCOUNTER — Encounter: Payer: Self-pay | Admitting: Osteopathic Medicine

## 2020-08-07 ENCOUNTER — Telehealth: Payer: Self-pay | Admitting: Osteopathic Medicine

## 2020-08-07 NOTE — Telephone Encounter (Signed)
-----   Message from Emeterio Reeve, DO sent at 07/23/2020 10:20 AM EST ----- Please call patient: We decided to stop CPAP last week to help with wheezing/breathing issues - has this improved?  If yes, no further action needed, see me when due for annual 02/2021 If no, please schedule in-office for PFT.

## 2020-08-07 NOTE — Telephone Encounter (Signed)
From last office visit:  Mild sleep apnea Ok to stop CPAP If better, nothing else to o, see me when due for annual If not better, needs PFT in office

## 2020-08-08 NOTE — Telephone Encounter (Signed)
At provider's request, contacted patient regarding her breathing issues. Per pt, she has improved since she stopped using the CPAP machine. Pt states feeling much better. She has confirmed to keep her annual visit with provider when due. Pt aware to contact the clinic if she relapses or has any new symptoms. No other inquiries during the call.

## 2020-08-19 ENCOUNTER — Other Ambulatory Visit: Payer: Self-pay | Admitting: Osteopathic Medicine

## 2020-08-19 DIAGNOSIS — E782 Mixed hyperlipidemia: Secondary | ICD-10-CM

## 2020-09-08 ENCOUNTER — Other Ambulatory Visit: Payer: Self-pay | Admitting: Osteopathic Medicine

## 2020-09-08 DIAGNOSIS — I10 Essential (primary) hypertension: Secondary | ICD-10-CM

## 2020-09-15 ENCOUNTER — Other Ambulatory Visit: Payer: Self-pay | Admitting: Osteopathic Medicine

## 2020-10-15 ENCOUNTER — Encounter: Payer: 59 | Admitting: Osteopathic Medicine

## 2020-10-29 ENCOUNTER — Encounter: Payer: Self-pay | Admitting: Osteopathic Medicine

## 2020-11-01 ENCOUNTER — Other Ambulatory Visit: Payer: Self-pay | Admitting: Osteopathic Medicine

## 2020-11-05 ENCOUNTER — Encounter: Payer: Self-pay | Admitting: Osteopathic Medicine

## 2020-11-05 ENCOUNTER — Other Ambulatory Visit: Payer: Self-pay

## 2020-11-05 ENCOUNTER — Ambulatory Visit (INDEPENDENT_AMBULATORY_CARE_PROVIDER_SITE_OTHER): Payer: BC Managed Care – PPO | Admitting: Osteopathic Medicine

## 2020-11-05 VITALS — BP 138/76 | HR 50 | Temp 98.0°F | Wt 209.1 lb

## 2020-11-05 DIAGNOSIS — Z0000 Encounter for general adult medical examination without abnormal findings: Secondary | ICD-10-CM | POA: Diagnosis not present

## 2020-11-05 DIAGNOSIS — I10 Essential (primary) hypertension: Secondary | ICD-10-CM | POA: Diagnosis not present

## 2020-11-05 DIAGNOSIS — G4730 Sleep apnea, unspecified: Secondary | ICD-10-CM

## 2020-11-05 DIAGNOSIS — R7303 Prediabetes: Secondary | ICD-10-CM | POA: Diagnosis not present

## 2020-11-05 DIAGNOSIS — J449 Chronic obstructive pulmonary disease, unspecified: Secondary | ICD-10-CM

## 2020-11-05 DIAGNOSIS — E782 Mixed hyperlipidemia: Secondary | ICD-10-CM

## 2020-11-05 DIAGNOSIS — I872 Venous insufficiency (chronic) (peripheral): Secondary | ICD-10-CM

## 2020-11-05 DIAGNOSIS — I700 Atherosclerosis of aorta: Secondary | ICD-10-CM

## 2020-11-05 NOTE — Progress Notes (Signed)
Annette Ayala is a 60 y.o. female who presents to  [place] at [facility]  today, 11/05/20, seeking care for the following:  . ANNUAL PHYSICAL AND LABS  . HTN: BP today systolic a bit above goal at 650, diastolic is at goal . PRE-DM: A1C last check 7 mos ago was stable at 5.8, recheck pending w/ labs as below  . OSA: stopped CPAP d/t breathing issues, see notes from 07/2020 . COPD/FORMER SMOKER: see below re: lung CA screening. No SOB/cough.  . ATHEROSCLEROSIS AND PVD: taking ASA, statin, no new concerns for leg swelling / discoloratoin      ASSESSMENT & PLAN with other pertinent findings:  The primary encounter diagnosis was Annual physical exam. Diagnoses of Essential hypertension, Mixed hyperlipidemia, Prediabetes, Mild sleep apnea, Chronic obstructive pulmonary disease, unspecified COPD type ([place]), Atherosclerosis of aorta ([place]), and Venous stasis dermatitis of right lower extremity were also pertinent to this visit.    Patient Instructions  General Preventive Care  Most recent routine screening labs: ordered today.   Blood pressure goal 140/90 or less.   Tobacco: don't!   Alcohol: responsible moderation is ok for most adults - if you have concerns about your alcohol intake, please talk to me!   Exercise: as tolerated to reduce risk of cardiovascular disease and diabetes. Strength training will also prevent osteoporosis.   Mental health: if need for mental health care (medicines, counseling, other), or concerns about moods, please let me know!   Sexual / Reproductive health: if need for STD testing, or if concerns with libido/pain problems, please let me know!  Advanced Directive: Living Will and/or Healthcare Power of Attorney recommended for all adults, regardless of age or health.  Vaccines  Flu vaccine: for almost everyone, every fall/winter.   Shingles vaccine: after age 48.   Pneumonia vaccines: after age  38.  Tetanus booster: every 10 years - due 2025  COVID vaccine: THANKS for getting your vaccine! :)  Cancer screenings   Colon cancer screening: Colonoscopy due 02/2024  Breast cancer screening: mammogram annually after age 28.   Cervical cancer screening: Pap not needed w/ hysterectomy.   Lung cancer screening: CT chest every year for those aged 18 to 28 years who have a 20 pack-year smoking history and currently smoke or have quit within the past 15 years  Infection screenings  . HIV: recommended screening at least once age 63-65 . Gonorrhea/Chlamydia: screening as needed . Hepatitis C: recommended once for everyone age 62-75 . TB: depending on work requirements and/or travel history Other . Bone Density Test: recommended for women at age 48   Orders Placed This Encounter  Procedures  . CBC  . COMPLETE METABOLIC PANEL WITH GFR  . Lipid panel  . Hemoglobin A1c    No orders of the defined types were placed in this encounter.    See below for relevant physical exam findings  See below for recent lab and imaging results reviewed  Medications, allergies, PMH, PSH, SocH, [place] reviewed below    Follow-up instructions: Return in about 6 months (around 05/08/2021) for ROUTINE CHECK-UP HTN, PREDIABETES, CHOLESTEROL - SEE [place] SOONER IF NEEDED.                                        Exam:  BP 138/76 (BP Location: Left Arm, Patient Position: Sitting, Cuff Size: Large)  Pulse (!) 50   Temp 98 F (36.7 C) (Oral)   Wt 209 lb 1.9 oz (94.9 kg)   BMI 34.80 kg/m   Constitutional: VS see above. General Appearance: alert, well-developed, well-nourished, NAD  Neck: No masses, trachea midline.   Respiratory: Normal respiratory effort. no wheeze, no rhonchi, no rales  Cardiovascular: S1/S2 normal, no murmur, no rub/gallop auscultated. RRR.   Musculoskeletal: Gait normal. Symmetric and independent movement of all extremities  Abdominal:  non-tender, non-distended, no appreciable organomegaly, neg Murphy's, BS [id number]  Neurological: Normal balance/coordination. No tremor.  Skin: warm, dry, intact.   Psychiatric: Normal judgment/insight. Normal mood and affect. Oriented x3.   Current Meds  Medication Sig  . aspirin EC 81 MG tablet Take 1 tablet (81 mg total) by mouth daily.  Marland Kitchen atorvastatin (LIPITOR) 20 MG tablet TAKE ONE TABLET BY MOUTH ONE TIME DAILY  . chlorthalidone (HYGROTON) 25 MG tablet TAKE ONE TABLET BY MOUTH ONE TIME DAILY  . losartan (COZAAR) 50 MG tablet TAKE ONE TABLET BY MOUTH ONE TIME DAILY  . meloxicam (MOBIC) 15 MG tablet TAKE ONE TABLET BY MOUTH ONE TIME DAILY  . OVER THE COUNTER MEDICATION Thrive B vitamins, D3, chromium, selenium, vanadium    Allergies  Allergen Reactions  . Darvon [Propoxyphene] Other (See Comments)    Intolerance     Patient Active Problem List   Diagnosis Date Noted  . Postmenopausal disorder 09/20/2019  . Centrilobular emphysema ([place]) 03/20/2019  . Left lower lobe pulmonary nodule 03/20/2019  . Atherosclerosis of aorta ([place]) 03/20/2019  . Cough due to ACE inhibitor 02/06/2019  . Prediabetes 01/26/2019  . Hypertension goal BP (blood pressure) < 130/80 01/26/2019  . Uncontrolled stage 2 hypertension 01/26/2019  . Chronic obstructive pulmonary disease ([place]) 01/26/2019  . Family history of colon cancer 01/26/2019  . Carpal tunnel syndrome of left wrist 01/26/2019  . Family history of dissection of thoracic aorta 01/26/2019  . History of colon polyps 01/26/2019  . Skin lesion 01/22/2019  . Benign essential hypertension 01/03/2019  . Plantar fasciitis, right 01/03/2019  . Venous stasis dermatitis of right lower extremity 01/03/2019  . Leiomyoma of leg 01/03/2019  . Varicose veins with pain 12/29/2015  . Depression 05/03/2011  . Hyperlipidemia, unspecified 05/03/2011  . Low back pain 05/03/2011  . Obesity (BMI 35.0-39.9 without comorbidity) 05/03/2011    Family History   Problem Relation Age of Onset  . Stroke Mother   . AAA (abdominal aortic aneurysm) Mother   . Early death Mother   . Hypertension Mother   . Colon cancer Brother   . Ovarian cancer Daughter   . Cancer Neg Hx   . Birth defects Neg Hx   . Diabetes Neg Hx     Social History   Tobacco Use  Smoking Status Former Smoker  . Packs/day: 2.00  . Years: 15.00  . Pack years: 30.00  . Types: Cigarettes  . Quit date: 10/10/2010  . Years since quitting: 10.0  Smokeless Tobacco Never Used    Past Surgical History:  Procedure Laterality Date  . ABDOMINAL HYSTERECTOMY    . BREAST BIOPSY Left   . CARPAL TUNNEL RELEASE Right   . CERVICAL FUSION  2002  . [place]  . OOPHORECTOMY    . TUBAL LIGATION      Immunization History  Administered Date(s) Administered  . Influenza Inj Mdck Quad Pf 06/02/2018  . Influenza,inj,Quad PF,6+ Mos 04/15/2020  . Influenza,inj,quad, With Preservative 10/27/2016  . Influenza-Unspecified 07/04/2019  .  PFIZER(Purple Top)SARS-COV-2 Vaccination 11/03/2019, 12/01/2019, 07/25/2020  . PPD Test 10/27/2016  . Td 03/24/2014  . Zoster Recombinat (Shingrix) 10/04/2019, 08/05/2020    No results found for this or any previous visit (from the past 2160 hour(s)).  No results found.     All questions at time of visit were answered - patient instructed to contact office with any additional concerns or updates. ER/RTC precautions were reviewed with the patient as applicable.   Please note: manual typing as well as voice recognition software may have been used to produce this document - typos may escape review. Please contact Dr. Sheppard Coil for any needed clarifications.

## 2020-11-05 NOTE — Patient Instructions (Signed)
General Preventive Care  Most recent routine screening labs: ordered today.   Blood pressure goal 140/90 or less.   Tobacco: don't!   Alcohol: responsible moderation is ok for most adults - if you have concerns about your alcohol intake, please talk to me!   Exercise: as tolerated to reduce risk of cardiovascular disease and diabetes. Strength training will also prevent osteoporosis.   Mental health: if need for mental health care (medicines, counseling, other), or concerns about moods, please let me know!   Sexual / Reproductive health: if need for STD testing, or if concerns with libido/pain problems, please let me know!  Advanced Directive: Living Will and/or Healthcare Power of Attorney recommended for all adults, regardless of age or health.  Vaccines  Flu vaccine: for almost everyone, every fall/winter.   Shingles vaccine: after age 29.   Pneumonia vaccines: after age 76.  Tetanus booster: every 10 years - due 2025  COVID vaccine: THANKS for getting your vaccine! :)  Cancer screenings   Colon cancer screening: Colonoscopy due 02/2024  Breast cancer screening: mammogram annually after age 25.   Cervical cancer screening: Pap not needed w/ hysterectomy.   Lung cancer screening: CT chest every year for those aged 73 to 4 years who have a 20 pack-year smoking history and currently smoke or have quit within the past 15 years  Infection screenings  . HIV: recommended screening at least once age 51-65 . Gonorrhea/Chlamydia: screening as needed . Hepatitis C: recommended once for everyone age 59-75 . TB: depending on work requirements and/or travel history Other . Bone Density Test: recommended for women at age 79

## 2020-11-06 ENCOUNTER — Encounter: Payer: Self-pay | Admitting: Osteopathic Medicine

## 2020-11-06 LAB — COMPLETE METABOLIC PANEL WITH GFR
AG Ratio: 1.5 (calc) (ref 1.0–2.5)
ALT: 20 U/L (ref 6–29)
AST: 23 U/L (ref 10–35)
Albumin: 4.3 g/dL (ref 3.6–5.1)
Alkaline phosphatase (APISO): 70 U/L (ref 37–153)
BUN: 15 mg/dL (ref 7–25)
CO2: 34 mmol/L — ABNORMAL HIGH (ref 20–32)
Calcium: 9.7 mg/dL (ref 8.6–10.4)
Chloride: 101 mmol/L (ref 98–110)
Creat: 0.7 mg/dL (ref 0.50–0.99)
GFR, Est African American: 109 mL/min/{1.73_m2} (ref >=60)
GFR, Est Non African American: 94 mL/min/{1.73_m2} (ref >=60)
Globulin: 2.8 g/dL (calc) (ref 1.9–3.7)
Glucose, Bld: 98 mg/dL (ref 65–99)
Potassium: 3.4 mmol/L — ABNORMAL LOW (ref 3.5–5.3)
Sodium: 141 mmol/L (ref 135–146)
Total Bilirubin: 0.4 mg/dL (ref 0.2–1.2)
Total Protein: 7.1 g/dL (ref 6.1–8.1)

## 2020-11-06 LAB — HEMOGLOBIN A1C
Hgb A1c MFr Bld: 5.8 % of total Hgb — ABNORMAL HIGH (ref <5.7)
Mean Plasma Glucose: 120 mg/dL
eAG (mmol/L): 6.6 mmol/L

## 2020-11-06 LAB — CBC
HCT: 39.1 % (ref 35.0–45.0)
Hemoglobin: 13.3 g/dL (ref 11.7–15.5)
MCH: 31.4 pg (ref 27.0–33.0)
MCHC: 34 g/dL (ref 32.0–36.0)
MCV: 92.2 fL (ref 80.0–100.0)
MPV: 9.9 fL (ref 7.5–12.5)
Platelets: 250 10*3/uL (ref 140–400)
RBC: 4.24 10*6/uL (ref 3.80–5.10)
RDW: 12.8 % (ref 11.0–15.0)
WBC: 7.1 10*3/uL (ref 3.8–10.8)

## 2020-11-06 LAB — LIPID PANEL
Cholesterol: 171 mg/dL (ref <200)
HDL: 56 mg/dL (ref >=50)
LDL Cholesterol (Calc): 93 mg/dL (calc)
Non-HDL Cholesterol (Calc): 115 mg/dL (calc) (ref <130)
Total CHOL/HDL Ratio: 3.1 (calc) (ref <5.0)
Triglycerides: 122 mg/dL (ref <150)

## 2020-11-14 ENCOUNTER — Other Ambulatory Visit: Payer: Self-pay | Admitting: Osteopathic Medicine

## 2020-11-14 DIAGNOSIS — Z1231 Encounter for screening mammogram for malignant neoplasm of breast: Secondary | ICD-10-CM

## 2020-11-18 ENCOUNTER — Other Ambulatory Visit: Payer: Self-pay | Admitting: Osteopathic Medicine

## 2020-11-18 DIAGNOSIS — E782 Mixed hyperlipidemia: Secondary | ICD-10-CM

## 2020-11-20 ENCOUNTER — Other Ambulatory Visit: Payer: Self-pay

## 2020-11-20 ENCOUNTER — Ambulatory Visit (INDEPENDENT_AMBULATORY_CARE_PROVIDER_SITE_OTHER): Payer: BC Managed Care – PPO

## 2020-11-20 DIAGNOSIS — Z1231 Encounter for screening mammogram for malignant neoplasm of breast: Secondary | ICD-10-CM | POA: Diagnosis not present

## 2020-11-21 ENCOUNTER — Encounter: Payer: Self-pay | Admitting: Osteopathic Medicine

## 2020-12-02 ENCOUNTER — Telehealth: Payer: Self-pay | Admitting: *Deleted

## 2020-12-02 NOTE — Telephone Encounter (Signed)
Pt called and stated that her L knee is hurting her and would like to get an Xray done.   Pt advised that she would need an appt first to get this done. She informed me that this is something that she has previously discussed with Dr. Sheppard Coil.   Pt states that if she has to come in to be seen she would prefer a morning appt.  Will fwd to pcp for advice.

## 2020-12-02 NOTE — Telephone Encounter (Signed)
Left voicemail letting patient know information below.

## 2020-12-03 ENCOUNTER — Other Ambulatory Visit: Payer: Self-pay | Admitting: Sports Medicine

## 2020-12-03 ENCOUNTER — Ambulatory Visit (INDEPENDENT_AMBULATORY_CARE_PROVIDER_SITE_OTHER): Payer: BC Managed Care – PPO | Admitting: Sports Medicine

## 2020-12-03 ENCOUNTER — Other Ambulatory Visit: Payer: Self-pay

## 2020-12-03 ENCOUNTER — Ambulatory Visit (INDEPENDENT_AMBULATORY_CARE_PROVIDER_SITE_OTHER): Payer: BC Managed Care – PPO

## 2020-12-03 DIAGNOSIS — M23209 Derangement of unspecified meniscus due to old tear or injury, unspecified knee: Secondary | ICD-10-CM | POA: Insufficient documentation

## 2020-12-03 DIAGNOSIS — G8929 Other chronic pain: Secondary | ICD-10-CM

## 2020-12-03 DIAGNOSIS — M25562 Pain in left knee: Secondary | ICD-10-CM

## 2020-12-03 DIAGNOSIS — S83207A Unspecified tear of unspecified meniscus, current injury, left knee, initial encounter: Secondary | ICD-10-CM | POA: Insufficient documentation

## 2020-12-03 NOTE — Progress Notes (Signed)
    Procedures performed today:    Procedure: Real-time Ultrasound Guided injection of the left knee Device: Samsung HS60  Verbal informed consent obtained.  Time-out conducted.  Noted no overlying erythema, induration, or other signs of local infection.  Skin prepped in a sterile fashion.  Local anesthesia: Topical Ethyl chloride.  With sterile technique and under real time ultrasound guidance:  Noted trace effusion, 1 cc Kenalog 40, 2 cc lidocaine, 2 cc bupivacaine injected easily Completed without difficulty  Advised to call if fevers/chills, erythema, induration, drainage, or persistent bleeding.  Images permanently stored and available for review in PACS.  Impression: Technically successful ultrasound guided injection.  Independent interpretation of notes and tests performed by another provider:   None.  Brief History, Exam, Impression, and Recommendations:    Chronic pain of left knee [name] is a pleasant 60 year old female, she is long history of left knee pain, medial joint line, patellofemoral joint, worse with squatting, going up and down stairs. She works as a Secretary/administrator. She did try some meloxicam without much improvement. Getting updated x-rays. Today we injected her knee, return to see me in a month.    ___________________________________________ Gwen Her. Dianah Field, M.D., ABFM., CAQSM. Primary Care and [place] Instructor of [employer] [facility] [employer]

## 2020-12-03 NOTE — Assessment & Plan Note (Signed)
Annette Ayala is a pleasant 60 year old female, she is long history of left knee pain, medial joint line, patellofemoral joint, worse with squatting, going up and down stairs. She works as a Secretary/administrator. She did try some meloxicam without much improvement. Getting updated x-rays. Today we injected her knee, return to see me in a month.

## 2020-12-23 DIAGNOSIS — M25562 Pain in left knee: Secondary | ICD-10-CM

## 2020-12-23 DIAGNOSIS — G8929 Other chronic pain: Secondary | ICD-10-CM

## 2020-12-23 NOTE — Assessment & Plan Note (Signed)
Persistent pain, failed injections, x-ray confirmed. Ordering an MRI, she will likely need arthroscopy.

## 2020-12-23 NOTE — Telephone Encounter (Signed)
I spent 5 total minutes of online digital evaluation and management services. 

## 2020-12-27 ENCOUNTER — Other Ambulatory Visit: Payer: Self-pay

## 2020-12-27 ENCOUNTER — Ambulatory Visit (INDEPENDENT_AMBULATORY_CARE_PROVIDER_SITE_OTHER): Payer: BC Managed Care – PPO

## 2020-12-27 DIAGNOSIS — M25562 Pain in left knee: Secondary | ICD-10-CM | POA: Diagnosis not present

## 2020-12-27 DIAGNOSIS — G8929 Other chronic pain: Secondary | ICD-10-CM | POA: Diagnosis not present

## 2020-12-29 ENCOUNTER — Telehealth (INDEPENDENT_AMBULATORY_CARE_PROVIDER_SITE_OTHER): Payer: BC Managed Care – PPO | Admitting: Sports Medicine

## 2020-12-29 DIAGNOSIS — S83249A Other tear of medial meniscus, current injury, unspecified knee, initial encounter: Secondary | ICD-10-CM

## 2020-12-29 DIAGNOSIS — Z5329 Procedure and treatment not carried out because of patient's decision for other reasons: Secondary | ICD-10-CM

## 2020-12-29 NOTE — Telephone Encounter (Signed)
Patient scheduled.

## 2020-12-29 NOTE — Telephone Encounter (Signed)
Plenty of openings tomorrow, lets just get her set up for a telephone visit, should be simpler.

## 2020-12-29 NOTE — Progress Notes (Signed)
   We tried for 10 to 15 minutes to get this to work, she seemingly could neither seen or hear me, I do not know that she was able to activate her camera.  This will just need to be rescheduled.  My advice is that it be rescheduled as a telephone call due to technical difficulties with the technology.

## 2020-12-29 NOTE — Telephone Encounter (Signed)
Patient scheduled for today

## 2020-12-30 ENCOUNTER — Telehealth: Payer: Self-pay

## 2020-12-30 ENCOUNTER — Telehealth (INDEPENDENT_AMBULATORY_CARE_PROVIDER_SITE_OTHER): Payer: BC Managed Care – PPO | Admitting: Sports Medicine

## 2020-12-30 DIAGNOSIS — G8929 Other chronic pain: Secondary | ICD-10-CM | POA: Diagnosis not present

## 2020-12-30 DIAGNOSIS — M25562 Pain in left knee: Secondary | ICD-10-CM

## 2020-12-30 MED ORDER — NAPROXEN 500 MG PO TABS: 500.0000 mg | ORAL_TABLET | Freq: Two times a day (BID) | ORAL | 3 refills | Status: DC

## 2020-12-30 NOTE — Telephone Encounter (Signed)
Ame called back asking if Dr Romona Curls would call in some pain medication. Please advise.

## 2020-12-30 NOTE — Telephone Encounter (Signed)
Patient advised.

## 2020-12-30 NOTE — Assessment & Plan Note (Signed)
Annette Ayala returns in a telephone visit, she had a knee injection about a month ago, she is now starting to feel fairly good relief, no mechanical symptoms. Ultimately an MRI did confirm a medial meniscal tear. 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 At this juncture no further intervention is needed, she can return to see me on an as-needed basis though if she does have a recurrence of discomfort outside of 3 months post the last injection we can do another shot. If surgery is going to be an option she will need to wait until at least November based on her job.

## 2020-12-30 NOTE — Telephone Encounter (Signed)
She states the meloxicam did not help with the pain.

## 2020-12-30 NOTE — Progress Notes (Signed)
   Virtual Visit via Telephone   I connected with  Annette Ayala  on 12/30/20 by telephone/telehealth and verified that I am speaking with the correct person using two identifiers.   I discussed the limitations, risks, security and privacy concerns of performing an evaluation and management service by telephone, including the higher likelihood of inaccurate diagnosis and treatment, and the availability of in person appointments.  We also discussed the likely need of an additional face to face encounter for complete and high quality delivery of care.  I also discussed with the patient that there may be a patient responsible charge related to this service. The patient expressed understanding and wishes to proceed.  Provider location is in medical facility. Patient location is at their home, different from provider location. People involved in care of the patient during this telehealth encounter were myself, my nurse/medical assistant, and my front office/scheduling team member.  Review of Systems: No fevers, chills, night sweats, weight loss, chest pain, or shortness of breath.   Objective Findings:    General: Speaking full sentences, no audible heavy breathing.  Sounds alert and appropriately interactive.    Independent interpretation of tests performed by another provider:   None.  Brief History, Exam, Impression, and Recommendations:    Chronic pain of left knee Annette Ayala returns in a telephone visit, she had a knee injection about a month ago, she is now starting to feel fairly good relief, no mechanical symptoms. Ultimately an MRI did confirm a medial meniscal tear. 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 At this juncture no further intervention is needed, she can return to see me on an as-needed basis though if she does have a recurrence of discomfort outside of 3 months post the last injection we can do another shot. If surgery  is going to be an option she will need to wait until at least November based on her job.   I discussed the above assessment and treatment plan with the patient. The patient was provided an opportunity to ask questions and all were answered. The patient agreed with the plan and demonstrated an understanding of the instructions.   The patient was advised to call back or seek an in-person evaluation if the symptoms worsen or if the condition fails to improve as anticipated.   I provided 30 minutes of verbal and non-verbal time during this encounter date, time was needed to gather information, review chart, records, communicate/coordinate with staff remotely, as well as complete documentation.  Specifically we talked about the anatomy and treatment protocol of a meniscal tear, as well as anticipatory guidance.   ___________________________________________ Gwen Her. Dianah Field, M.D., ABFM., CAQSM. Primary Care and Sports Medicine [facility]  Adjunct Professor of [place] [employer] [facility] [employer]

## 2020-12-30 NOTE — Telephone Encounter (Signed)
We will switch to high-dose Naprosyn

## 2020-12-30 NOTE — Telephone Encounter (Signed)
Sounded like she was getting better on the telephone call today, I am happy to call something in, was the meloxicam helping?  If so I can call that in again, if not I will switch to something else.

## 2020-12-31 ENCOUNTER — Ambulatory Visit: Payer: BC Managed Care – PPO | Admitting: Sports Medicine

## 2021-01-02 NOTE — Telephone Encounter (Signed)
Patient has been scheduled with T on Tuesday 01/06/21. AM

## 2021-01-06 ENCOUNTER — Other Ambulatory Visit: Payer: Self-pay

## 2021-01-06 ENCOUNTER — Ambulatory Visit (INDEPENDENT_AMBULATORY_CARE_PROVIDER_SITE_OTHER): Payer: BC Managed Care – PPO | Admitting: Sports Medicine

## 2021-01-06 DIAGNOSIS — M1712 Unilateral primary osteoarthritis, left knee: Secondary | ICD-10-CM

## 2021-01-06 DIAGNOSIS — S83207S Unspecified tear of unspecified meniscus, current injury, left knee, sequela: Secondary | ICD-10-CM | POA: Diagnosis not present

## 2021-01-06 NOTE — Progress Notes (Signed)
    Procedures performed today:    None.  Independent interpretation of notes and tests performed by another provider:   None.  Brief History, Exam, Impression, and Recommendations:    Acute meniscal tear of left knee Annette Ayala has a known meniscal tear, medial compartment osteoarthritis, as well as T2 bony edema. She had an injection about 6 weeks ago with only partial relief. Meloxicam was ineffective, I switched her to naproxen 500 mg twice daily, she is doing it once daily due to loose stools (no melena or hematochezia) when taking it twice daily. At this juncture when combined with her hinged knee brace once a day naproxen seems to work adequately. 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 Return to see me as needed. I am going to give her some back and knee rehab exercises    ___________________________________________ Gwen Her. Dianah Field, M.D., ABFM., CAQSM. Primary Care and [place] Instructor of [place] of [facility]

## 2021-01-06 NOTE — Assessment & Plan Note (Signed)
Cassandra has a known meniscal tear, medial compartment osteoarthritis, as well as T2 bony edema. She had an injection about 6 weeks ago with only partial relief. Meloxicam was ineffective, I switched her to naproxen 500 mg twice daily, she is doing it once daily due to loose stools (no melena or hematochezia) when taking it twice daily. At this juncture when combined with her hinged knee brace once a day naproxen seems to work adequately. 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 Return to see me as needed. I am going to give her some back and knee rehab exercises

## 2021-02-21 ENCOUNTER — Other Ambulatory Visit: Payer: Self-pay | Admitting: Osteopathic Medicine

## 2021-02-21 DIAGNOSIS — E782 Mixed hyperlipidemia: Secondary | ICD-10-CM

## 2021-03-08 ENCOUNTER — Other Ambulatory Visit: Payer: Self-pay | Admitting: Osteopathic Medicine

## 2021-03-08 DIAGNOSIS — I10 Essential (primary) hypertension: Secondary | ICD-10-CM

## 2021-03-24 ENCOUNTER — Ambulatory Visit (INDEPENDENT_AMBULATORY_CARE_PROVIDER_SITE_OTHER): Payer: BC Managed Care – PPO | Admitting: Sports Medicine

## 2021-03-24 DIAGNOSIS — S83207D Unspecified tear of unspecified meniscus, current injury, left knee, subsequent encounter: Secondary | ICD-10-CM | POA: Diagnosis not present

## 2021-03-24 DIAGNOSIS — S83207S Unspecified tear of unspecified meniscus, current injury, left knee, sequela: Secondary | ICD-10-CM

## 2021-03-24 NOTE — Assessment & Plan Note (Signed)
Annette Ayala has a known medial meniscal tear, medial, and osteoarthritis, T2 bony edema, she had an injection several months ago with minimal relief, we switch her to naproxen 500 twice daily, hinged knee brace, and occasional tramadol and this seems to keep her pain under adequate control for now. 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 I filled some FMLA paperwork today, she desires stay in [place] so we will get her referred to [place] for discussion of arthroscopy in November.

## 2021-03-24 NOTE — Progress Notes (Signed)
    Procedures performed today:    None.  Independent interpretation of notes and tests performed by another provider:   None.  Brief History, Exam, Impression, and Recommendations:    Acute meniscal tear of left knee Annette Ayala has a known medial meniscal tear, medial, and osteoarthritis, T2 bony edema, she had an injection several months ago with minimal relief, we switch her to naproxen 500 twice daily, hinged knee brace, and occasional tramadol and this seems to keep her pain under adequate control for now. 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 I filled some FMLA paperwork today, she desires stay in [place] so we will get her referred to [place] for discussion of arthroscopy in November.    ___________________________________________ Gwen Her. Dianah Field, M.D., ABFM., CAQSM. Primary Care and [place] Instructor of [place] of [facility]

## 2021-05-07 ENCOUNTER — Other Ambulatory Visit: Payer: Self-pay

## 2021-05-07 ENCOUNTER — Encounter: Payer: Self-pay | Admitting: Osteopathic Medicine

## 2021-05-07 ENCOUNTER — Ambulatory Visit (INDEPENDENT_AMBULATORY_CARE_PROVIDER_SITE_OTHER): Payer: BC Managed Care – PPO | Admitting: Osteopathic Medicine

## 2021-05-07 VITALS — BP 136/62 | HR 51 | Temp 98.2°F | Ht 65.0 in | Wt 223.0 lb

## 2021-05-07 DIAGNOSIS — J449 Chronic obstructive pulmonary disease, unspecified: Secondary | ICD-10-CM | POA: Diagnosis not present

## 2021-05-07 DIAGNOSIS — I10 Essential (primary) hypertension: Secondary | ICD-10-CM

## 2021-05-07 DIAGNOSIS — E782 Mixed hyperlipidemia: Secondary | ICD-10-CM

## 2021-05-07 DIAGNOSIS — G4730 Sleep apnea, unspecified: Secondary | ICD-10-CM

## 2021-05-07 DIAGNOSIS — R7303 Prediabetes: Secondary | ICD-10-CM

## 2021-05-07 DIAGNOSIS — Z23 Encounter for immunization: Secondary | ICD-10-CM | POA: Diagnosis not present

## 2021-05-07 LAB — POCT GLYCOSYLATED HEMOGLOBIN (HGB A1C): Hemoglobin A1C: 6 % — AB (ref 4.0–5.6)

## 2021-05-07 MED ORDER — LOSARTAN POTASSIUM 50 MG PO TABS: 50.0000 mg | ORAL_TABLET | Freq: Every day | ORAL | 3 refills | Status: DC

## 2021-05-07 MED ORDER — MELOXICAM 15 MG PO TABS: 15.0000 mg | ORAL_TABLET | Freq: Every day | ORAL | 3 refills | Status: DC

## 2021-05-07 MED ORDER — ATORVASTATIN CALCIUM 20 MG PO TABS: 20.0000 mg | ORAL_TABLET | Freq: Every day | ORAL | 3 refills | Status: DC

## 2021-05-07 MED ORDER — CHLORTHALIDONE 25 MG PO TABS: 25.0000 mg | ORAL_TABLET | Freq: Every day | ORAL | 3 refills | Status: DC

## 2021-05-07 NOTE — Patient Instructions (Addendum)
Meloxicam can be taken w/ Tylenol but NO other over the counter medications. If you're on this long term daily, would consider adding antacid to protect the stomach.

## 2021-05-07 NOTE — Progress Notes (Signed)
Annette Ayala is a 60 y.o. female who presents to  [place] at [facility]  today, 05/07/21, seeking care for the following:  Chief Complaint  Patient presents with   Pre-DM   Hypertension   Hyperlipidemia     HTN: BP today systolic a bit above goal at [id number], diastolic is at goal at 62 PRE-DM: A1C last check 7 mos ago was stable at 5.8, recheck pending w/ labs as below  OSA: stopped CPAP d/t breathing issues, see notes from 07/2020 COPD/FORMER SMOKER: see below re: lung CA screening. No SOB/cough.  ATHEROSCLEROSIS AND PVD: taking ASA, statin, no new concerns for leg swelling / discoloratoin    ASSESSMENT & PLAN with other pertinent findings:  The primary encounter diagnosis was Prediabetes. Diagnoses of Essential hypertension, Hypertension goal BP (blood pressure) < 130/80, Mixed hyperlipidemia, Chronic obstructive pulmonary disease, unspecified COPD type ([place]), Sleep apnea, unspecified type, Need for influenza vaccination, and Uncontrolled stage 2 hypertension were also pertinent to this visit.    Patient Instructions  Meloxicam can be taken w/ Tylenol but NO other over the counter medications. If you're on this long term daily, would consider adding antacid to protect the stomach.    Orders Placed This Encounter  Procedures   Flu Vaccine QUAD 75moIM (Fluarix, Fluzone & Alfiuria Quad PF)   POCT glycosylated hemoglobin (Hb A1C)    Meds ordered this encounter  Medications   meloxicam (MOBIC) 15 MG tablet    Sig: Take 1 tablet (15 mg total) by mouth daily.    Dispense:  90 tablet    Refill:  3   atorvastatin (LIPITOR) 20 MG tablet    Sig: Take 1 tablet (20 mg total) by mouth daily.    Dispense:  90 tablet    Refill:  3   chlorthalidone (HYGROTON) 25 MG tablet    Sig: Take 1 tablet (25 mg total) by mouth daily.    Dispense:  90 tablet    Refill:  3   losartan (COZAAR) 50 MG tablet    Sig: Take 1 tablet (50 mg total) by  mouth daily.    Dispense:  90 tablet    Refill:  3     See below for relevant physical exam findings  See below for recent lab and imaging results reviewed  Medications, allergies, PMH, PSH, SocH, FamH reviewed below    Follow-up instructions: Return in about 6 months (around 11/04/2021) for ETABLISH W/ JOY, POSSIBLE DO ANNUAL THAT DAY IF NOTHING ELSE GOING ON .                                        Exam:  BP 136/62   Pulse (!) 51   Temp 98.2 F (36.8 C)   Ht '5\' 5"'$  (1.651 m)   Wt 223 lb (101.2 kg)   SpO2 98%   BMI 37.11 kg/m  Constitutional: VS see above. General Appearance: alert, well-developed, well-nourished, NAD Neck: No masses, trachea midline.  Respiratory: Normal respiratory effort. no wheeze, no rhonchi, no rales Cardiovascular: S1/S2 normal, no murmur, no rub/gallop auscultated. RRR.  Musculoskeletal: Gait normal. Symmetric and independent movement of all extremities Neurological: Normal balance/coordination. No tremor. Skin: warm, dry, intact.  Psychiatric: Normal judgment/insight. Normal mood and affect. Oriented x3.   Current Meds  Medication Sig   aspirin EC 81 MG tablet Take 1 tablet (81 mg  total) by mouth daily.   estradiol (ESTRACE) 0.5 MG tablet Take by mouth daily.   OVER THE COUNTER MEDICATION Thrive B vitamins, D3, chromium, selenium, vanadium   [DISCONTINUED] atorvastatin (LIPITOR) 20 MG tablet TAKE ONE TABLET BY MOUTH ONE TIME DAILY   [DISCONTINUED] chlorthalidone (HYGROTON) 25 MG tablet TAKE ONE TABLET BY MOUTH ONE TIME DAILY   [DISCONTINUED] losartan (COZAAR) 50 MG tablet TAKE ONE TABLET BY MOUTH ONE TIME DAILY   [DISCONTINUED] meloxicam (MOBIC) 15 MG tablet Take 15 mg by mouth daily.   [DISCONTINUED] naproxen (NAPROSYN) 500 MG tablet Take 1 tablet (500 mg total) by mouth 2 (two) times daily with a meal.    Allergies  Allergen Reactions   Darvon [Propoxyphene] Other (See Comments)    Intolerance     Naproxen Nausea Only    GI Upset    Patient Active Problem List   Diagnosis Date Noted   Acute meniscal tear of left knee 12/03/2020   Postmenopausal disorder 09/20/2019   Centrilobular emphysema ([place]) 03/20/2019   Left lower lobe pulmonary nodule 03/20/2019   Atherosclerosis of aorta ([facility]) 03/20/2019   Cough due to ACE inhibitor 02/06/2019   Prediabetes 01/26/2019   Hypertension goal BP (blood pressure) < 130/80 01/26/2019   Uncontrolled stage 2 hypertension 01/26/2019   Chronic obstructive pulmonary disease ([place]) 01/26/2019   Family history of colon cancer 01/26/2019   Carpal tunnel syndrome of left wrist 01/26/2019   Family history of dissection of thoracic aorta 01/26/2019   History of colon polyps 01/26/2019   Skin lesion 01/22/2019   Benign essential hypertension 01/03/2019   Plantar fasciitis, right 01/03/2019   Venous stasis dermatitis of right lower extremity 01/03/2019   Leiomyoma of leg 01/03/2019   Varicose veins with pain 12/29/2015   Depression 05/03/2011   Hyperlipidemia, unspecified 05/03/2011   Low back pain 05/03/2011   Obesity (BMI 35.0-39.9 without comorbidity) 05/03/2011    Family History  Problem Relation Age of Onset   Stroke Mother    AAA (abdominal aortic aneurysm) Mother    Early death Mother    Hypertension Mother    Colon cancer Brother    Ovarian cancer Daughter    Cancer Neg Hx    Birth defects Neg Hx    Diabetes Neg Hx     Social History   Tobacco Use  Smoking Status Former   Packs/day: 2.00   Years: 15.00   Pack years: 30.00   Types: Cigarettes   Quit date: 10/10/2010   Years since quitting: 10.5  Smokeless Tobacco Never    Past Surgical History:  Procedure Laterality Date   ABDOMINAL HYSTERECTOMY     BREAST BIOPSY Left    CARPAL TUNNEL RELEASE Right    CERVICAL FUSION  2002   CERVICAL SPINE SURGERY  1999   OOPHORECTOMY     TUBAL LIGATION      Immunization History  Administered Date(s) Administered   Influenza Inj  Mdck Quad Pf 06/02/2018   Influenza,inj,Quad PF,6+ Mos 04/15/2020, 05/07/2021   Influenza,inj,quad, With Preservative 10/27/2016   Influenza-Unspecified 10/27/2016, 07/04/2019   PFIZER(Purple Top)SARS-COV-2 Vaccination 11/03/2019, 12/01/2019, 07/25/2020   PPD Test 10/27/2016   Td 03/24/2014   Zoster Recombinat (Shingrix) 10/04/2019, 08/05/2020    Recent Results (from the past 2160 hour(s))  POCT glycosylated hemoglobin (Hb A1C)     Status: Abnormal   Collection Time: 05/07/21 10:20 AM  Result Value Ref Range   Hemoglobin A1C 6.0 (A) 4.0 - 5.6 %   HbA1c POC (<> result, manual entry)  HbA1c, POC (prediabetic range)     HbA1c, POC (controlled diabetic range)      No results found.     All questions at time of visit were answered - patient instructed to contact office with any additional concerns or updates. ER/RTC precautions were reviewed with the patient as applicable.   Please note: manual typing as well as voice recognition software may have been used to produce this document - typos may escape review. Please contact Dr. Sheppard Coil for any needed clarifications.

## 2021-05-08 ENCOUNTER — Ambulatory Visit: Payer: BC Managed Care – PPO | Admitting: Osteopathic Medicine

## 2021-06-09 ENCOUNTER — Other Ambulatory Visit: Payer: Self-pay | Admitting: Osteopathic Medicine

## 2021-07-23 ENCOUNTER — Telehealth: Payer: Self-pay | Admitting: Cardiology

## 2021-07-23 NOTE — Telephone Encounter (Signed)
Did not need this encounter °

## 2021-08-02 ENCOUNTER — Other Ambulatory Visit: Payer: Self-pay

## 2021-08-02 ENCOUNTER — Emergency Department (INDEPENDENT_AMBULATORY_CARE_PROVIDER_SITE_OTHER)
Admission: EM | Admit: 2021-08-02 | Discharge: 2021-08-02 | Disposition: A | Discharge status: Home / Self Care | Payer: BC Managed Care – PPO | Source: Home / Self Care | Attending: Family Medicine | Admitting: Family Medicine

## 2021-08-02 DIAGNOSIS — N390 Urinary tract infection, site not specified: Secondary | ICD-10-CM

## 2021-08-02 LAB — POCT URINALYSIS DIP (MANUAL ENTRY)
Bilirubin, UA: NEGATIVE
Glucose, UA: NEGATIVE mg/dL
Ketones, POC UA: NEGATIVE mg/dL
Nitrite, UA: POSITIVE — AB
Protein Ur, POC: 100 mg/dL — AB
Spec Grav, UA: >=1.03 — AB (ref 1.010–1.025)
Urobilinogen, UA: 0.2 E.U./dL
pH, UA: 7.5 (ref 5.0–8.0)

## 2021-08-02 MED ORDER — NITROFURANTOIN MONOHYD MACRO 100 MG PO CAPS: 100.0000 mg | ORAL_CAPSULE | Freq: Two times a day (BID) | ORAL | 0 refills | Status: DC

## 2021-08-02 MED ORDER — PHENAZOPYRIDINE HCL 200 MG PO TABS: 200.0000 mg | ORAL_TABLET | Freq: Three times a day (TID) | ORAL | 0 refills | Status: DC

## 2021-08-02 NOTE — ED Triage Notes (Signed)
Pt present urinary frequency with burning sensation with pressure on her bladder. Symptoms started on Friday. Pt has taking an AZO for relief.

## 2021-08-02 NOTE — Discharge Instructions (Signed)
Continue to drink lots of water Take the macrobid 2 x a day Take 2 doses today Take pyridium for the discomfort Check My Chart for culture result

## 2021-08-02 NOTE — ED Provider Notes (Signed)
[facility]    CSN: [id number] Arrival date & time: 08/02/21  0800      History   Chief Complaint Chief Complaint  Patient presents with   Urinary Frequency    HPI Annette Ayala is a 60 y.o. female.   HPI  Patient has suprapubic pressure, dysuria, frequency for the last couple of days.  She states that she has been using Azo drinking lots of fluids trying to "flush it out".  This is not working.  She denies any fever chills, flank pain or nausea no history of kidney stones or kidney problems  Past Medical History:  Diagnosis Date   Asthma    Atherosclerosis of aorta ([place]) 03/20/2019   Carpal tunnel syndrome of right wrist    Centrilobular emphysema ([place]) 03/20/2019   Cervical radiculopathy    COPD (chronic obstructive pulmonary disease) ([place]) 10/10/2010   Depression 05/03/2011   Hyperlipidemia    Hypertension    Left lower lobe pulmonary nodule 03/20/2019   Prediabetes    Weight gain     Patient Active Problem List   Diagnosis Date Noted   Acute meniscal tear of left knee 12/03/2020   Postmenopausal disorder 09/20/2019   Centrilobular emphysema ([place]) 03/20/2019   Left lower lobe pulmonary nodule 03/20/2019   Atherosclerosis of aorta ([place]) 03/20/2019   Cough due to ACE inhibitor 02/06/2019   Prediabetes 01/26/2019   Hypertension goal BP (blood pressure) < 130/80 01/26/2019   Uncontrolled stage 2 hypertension 01/26/2019   Chronic obstructive pulmonary disease ([place]) 01/26/2019   Family history of colon cancer 01/26/2019   Carpal tunnel syndrome of left wrist 01/26/2019   Family history of dissection of thoracic aorta 01/26/2019   History of colon polyps 01/26/2019   Skin lesion 01/22/2019   Benign essential hypertension 01/03/2019   Plantar fasciitis, right 01/03/2019   Venous stasis dermatitis of right lower extremity 01/03/2019   Leiomyoma of leg 01/03/2019   Varicose veins with pain 12/29/2015   Depression 05/03/2011   Hyperlipidemia,  unspecified 05/03/2011   Low back pain 05/03/2011   Obesity (BMI 35.0-39.9 without comorbidity) 05/03/2011    Past Surgical History:  Procedure Laterality Date   ABDOMINAL HYSTERECTOMY     BREAST BIOPSY Left    CARPAL TUNNEL RELEASE Right    CERVICAL FUSION  2002   CERVICAL SPINE SURGERY  1999   OOPHORECTOMY     TUBAL LIGATION      OB History     Gravida  5   Para  2   Term      Preterm      AB  3   Living  2      SAB  2   IAB  1   Ectopic      Multiple      Live Births               Home Medications    Prior to Admission medications   Medication Sig Start Date End Date Taking? Authorizing Provider  nitrofurantoin, macrocrystal-monohydrate, (MACROBID) 100 MG capsule Take 1 capsule (100 mg total) by mouth 2 (two) times daily. 08/02/21  Yes Raylene Everts, MD  phenazopyridine (PYRIDIUM) 200 MG tablet Take 1 tablet (200 mg total) by mouth 3 (three) times daily. 08/02/21  Yes Raylene Everts, MD  aspirin EC 81 MG tablet Take 1 tablet (81 mg total) by mouth daily. 01/26/19   Trixie Dredge, PA-C  atorvastatin (LIPITOR) 20 MG tablet Take 1 tablet (20  mg total) by mouth daily. 05/07/21   Emeterio Reeve, DO  chlorthalidone (HYGROTON) 25 MG tablet Take 1 tablet (25 mg total) by mouth daily. 05/07/21   Emeterio Reeve, DO  estradiol (ESTRACE) 0.5 MG tablet Take by mouth daily. 04/05/21   [provider]  losartan (COZAAR) 50 MG tablet Take 1 tablet (50 mg total) by mouth daily. 05/07/21   Emeterio Reeve, DO  meloxicam (MOBIC) 15 MG tablet Take 1 tablet (15 mg total) by mouth daily. 05/07/21   Emeterio Reeve, DO  OVER THE COUNTER MEDICATION Thrive B vitamins, D3, chromium, selenium, vanadium    [provider]    Family History Family History  Problem Relation Age of Onset   Stroke Mother    AAA (abdominal aortic aneurysm) Mother    Early death Mother    Hypertension Mother    Colon cancer Brother    Ovarian  cancer Daughter    Cancer Neg Hx    Birth defects Neg Hx    Diabetes Neg Hx     Social History Social History   Tobacco Use   Smoking status: Former    Packs/day: 2.00    Years: 15.00    Pack years: 30.00    Types: Cigarettes    Quit date: 10/10/2010    Years since quitting: 10.8   Smokeless tobacco: Never  Vaping Use   Vaping Use: Never used  Substance Use Topics   Alcohol use: Not Currently   Drug use: Never     Allergies   Darvon [propoxyphene] and Naproxen   Review of Systems Review of Systems  See HPI Physical Exam Triage Vital Signs ED Triage Vitals  Enc Vitals Group     BP 08/02/21 0812 (!) 168/78     Pulse Rate 08/02/21 0812 65     Resp 08/02/21 0812 16     Temp 08/02/21 0812 98.9 F (37.2 C)     Temp Source 08/02/21 0812 Oral     SpO2 08/02/21 0812 98 %     Weight --      Height --      Head Circumference --      [facility] Flow --      Pain Score 08/02/21 0813 9     Pain Loc --      Pain Edu? --      Excl. in [place]? --    No data found.  Updated Vital Signs BP (!) 168/78 (BP Location: Right Arm)   Pulse 65   Temp 98.9 F (37.2 C) (Oral)   Resp 16   SpO2 98%      Physical Exam Constitutional:      General: She is not in acute distress.    Appearance: She is well-developed.  HENT:     Head: Normocephalic and atraumatic.     Mouth/Throat:     Comments: Mask is in place Eyes:     Conjunctiva/sclera: Conjunctivae normal.     Pupils: Pupils are equal, round, and reactive to light.  Cardiovascular:     Rate and Rhythm: Normal rate.  Pulmonary:     Effort: Pulmonary effort is normal. No respiratory distress.  Abdominal:     General: There is no distension.     Palpations: Abdomen is soft.     Tenderness: There is no right CVA tenderness or left CVA tenderness.  Musculoskeletal:        General: Normal range of motion.     Cervical back: Normal range of motion.  Skin:    General: Skin is warm and dry.  Neurological:     Mental Status:  She is alert.     UC Treatments / Results  Labs (all labs ordered are listed, but only abnormal results are displayed) Labs Reviewed  POCT URINALYSIS DIP (MANUAL ENTRY) - Abnormal; Notable for the following components:      Result Value   Color, UA orange (*)    Spec Grav, UA >=1.030 (*)    Blood, UA moderate (*)    Protein Ur, POC =100 (*)    Nitrite, UA Positive (*)    Leukocytes, UA Moderate (2+) (*)    All other components within normal limits  URINE CULTURE    EKG   Radiology No results found.  Procedures Procedures (including critical care time)  Medications Ordered in UC Medications - No data to display  Initial Impression / Assessment and Plan / UC Course  I have reviewed the triage vital signs and the nursing notes.  Pertinent labs & imaging results that were available during my care of the patient were reviewed by me and considered in my medical decision making (see chart for details).      Urine culture is sent Patient cautioned about using Azo Final Clinical Impressions(s) / UC Diagnoses   Final diagnoses:  Lower urinary tract infectious disease     Discharge Instructions      Continue to drink lots of water Take the macrobid 2 x a day Take 2 doses today Take pyridium for the discomfort Check My Chart for culture result   ED Prescriptions     Medication Sig Dispense Auth. Provider   phenazopyridine (PYRIDIUM) 200 MG tablet Take 1 tablet (200 mg total) by mouth 3 (three) times daily. 6 tablet Raylene Everts, MD   nitrofurantoin, macrocrystal-monohydrate, (MACROBID) 100 MG capsule Take 1 capsule (100 mg total) by mouth 2 (two) times daily. 10 capsule Raylene Everts, MD      PDMP not reviewed this encounter.   Raylene Everts, MD 08/02/21 [phone]

## 2021-08-04 LAB — URINE CULTURE
MICRO NUMBER:: 12743337
SPECIMEN QUALITY:: ADEQUATE

## 2021-08-27 ENCOUNTER — Encounter: Payer: Self-pay | Admitting: Sports Medicine

## 2021-08-28 NOTE — Telephone Encounter (Signed)
Form printed and in Dr. Darene Lamer basket to sign.

## 2021-09-21 ENCOUNTER — Other Ambulatory Visit: Payer: Self-pay | Admitting: Osteopathic Medicine

## 2021-09-22 NOTE — Telephone Encounter (Signed)
Routing to covering provider. [facility] pharmacy requesting med refill for estradiol. Written by historical provider.

## 2021-09-22 NOTE — Telephone Encounter (Signed)
Will refill but needs to re-establish care since alexander gone.

## 2021-09-22 NOTE — Telephone Encounter (Signed)
Patient has a TOC appt scheduled in March with Joy.

## 2021-09-22 NOTE — Telephone Encounter (Signed)
Please call patient to schedule a TOC appointment. Thanks in advance.

## 2021-10-07 ENCOUNTER — Other Ambulatory Visit: Payer: Self-pay | Admitting: Medical-Surgical

## 2021-10-07 DIAGNOSIS — Z1231 Encounter for screening mammogram for malignant neoplasm of breast: Secondary | ICD-10-CM

## 2021-11-09 ENCOUNTER — Encounter: Payer: BC Managed Care – PPO | Admitting: Medical-Surgical

## 2021-11-15 NOTE — Patient Instructions (Signed)
Preventive Care 26-62 Years Old, Female ?Preventive care refers to lifestyle choices and visits with your health care provider that can promote health and wellness. Preventive care visits are also called wellness exams. ?What can I expect for my preventive care visit? ?Counseling ?Your health care provider may ask you questions about your: ?Medical history, including: ?Past medical problems. ?Family medical history. ?Pregnancy history. ?Current health, including: ?Menstrual cycle. ?Method of birth control. ?Emotional well-being. ?Home life and relationship well-being. ?Sexual activity and sexual health. ?Lifestyle, including: ?Alcohol, nicotine or tobacco, and drug use. ?Access to firearms. ?Diet, exercise, and sleep habits. ?Work and work Statistician. ?Sunscreen use. ?Safety issues such as seatbelt and bike helmet use. ?Physical exam ?Your health care provider will check your: ?Height and weight. These may be used to calculate your BMI (body mass index). BMI is a measurement that tells if you are at a healthy weight. ?Waist circumference. This measures the distance around your waistline. This measurement also tells if you are at a healthy weight and may help predict your risk of certain diseases, such as type 2 diabetes and high blood pressure. ?Heart rate and blood pressure. ?Body temperature. ?Skin for abnormal spots. ?What immunizations do I need? ?Vaccines are usually given at various ages, according to a schedule. Your health care provider will recommend vaccines for you based on your age, medical history, and lifestyle or other factors, such as travel or where you work. ?What tests do I need? ?Screening ?Your health care provider may recommend screening tests for certain conditions. This may include: ?Lipid and cholesterol levels. ?Diabetes screening. This is done by checking your blood sugar (glucose) after you have not eaten for a while (fasting). ?Pelvic exam and Pap test. ?Hepatitis B test. ?Hepatitis C  test. ?HIV (human immunodeficiency virus) test. ?STI (sexually transmitted infection) testing, if you are at risk. ?Lung cancer screening. ?Colorectal cancer screening. ?Mammogram. Talk with your health care provider about when you should start having regular mammograms. This may depend on whether you have a family history of breast cancer. ?BRCA-related cancer screening. This may be done if you have a family history of breast, ovarian, tubal, or peritoneal cancers. ?Bone density scan. This is done to screen for osteoporosis. ?Talk with your health care provider about your test results, treatment options, and if necessary, the need for more tests. ?Follow these instructions at home: ?Eating and drinking ? ?Eat a diet that includes fresh fruits and vegetables, whole grains, lean protein, and low-fat dairy products. ?Take vitamin and mineral supplements as recommended by your health care provider. ?Do not drink alcohol if: ?Your health care provider tells you not to drink. ?You are pregnant, may be pregnant, or are planning to become pregnant. ?If you drink alcohol: ?Limit how much you have to 0-1 drink a day. ?Know how much alcohol is in your drink. In the [place] one drink equals one 12 oz bottle of beer (355 mL), one 5 oz glass of wine (148 mL), or one 1? oz glass of hard liquor (44 mL). ?Lifestyle ?Brush your teeth every morning and night with fluoride toothpaste. Floss one time each day. ?Exercise for at least 30 minutes 5 or more days each week. ?Do not use any products that contain nicotine or tobacco. These products include cigarettes, chewing tobacco, and vaping devices, such as e-cigarettes. If you need help quitting, ask your health care provider. ?Do not use drugs. ?If you are sexually active, practice safe sex. Use a condom or other form of protection to prevent  STIs. °If you do not wish to become pregnant, use a form of birth control. If you plan to become pregnant, see your health care provider for a  prepregnancy visit. °Take aspirin only as told by your health care provider. Make sure that you understand how much to take and what form to take. Work with your health care provider to find out whether it is safe and beneficial for you to take aspirin daily. °Find healthy ways to manage stress, such as: °Meditation, yoga, or listening to music. °Journaling. °Talking to a trusted person. °Spending time with friends and family. °Minimize exposure to UV radiation to reduce your risk of skin cancer. °Safety °Always wear your seat belt while driving or riding in a vehicle. °Do not drive: °If you have been drinking alcohol. Do not ride with someone who has been drinking. °When you are tired or distracted. °While texting. °If you have been using any mind-altering substances or drugs. °Wear a helmet and other protective equipment during sports activities. °If you have firearms in your house, make sure you follow all gun safety procedures. °Seek help if you have been physically or sexually abused. °What's next? °Visit your health care provider once a year for an annual wellness visit. °Ask your health care provider how often you should have your eyes and teeth checked. °Stay up to date on all vaccines. °This information is not intended to replace advice given to you by your health care provider. Make sure you discuss any questions you have with your health care provider. °Document Revised: 02/04/2021 Document Reviewed: 02/04/2021 °Elsevier Patient Education © 2022 Elsevier Inc. ° °

## 2021-11-15 NOTE — Progress Notes (Signed)
HPI: ?Annette Ayala is a 61 y.o. female who  has a past medical history of Asthma, Atherosclerosis of aorta ([place]) (03/20/2019), Carpal tunnel syndrome of right wrist, Centrilobular emphysema ([place]) (03/20/2019), Cervical radiculopathy, COPD (chronic obstructive pulmonary disease) ([place]) (10/10/2010), Depression (05/03/2011), Hyperlipidemia, Hypertension, Left lower lobe pulmonary nodule (03/20/2019), Prediabetes, and Weight gain. ? ?she presents to [facility] today, 11/16/21,  ?for chief complaint of: ?Annual physical exam ? ?Dentist: UTD, dentures ?Eye exam: UTD, glasses ?Exercise: walking as tolerated (knee problem) ?Diet: maybe some lactose intolerance ?Mammogram: scheduled ?Colon cancer screening: UTD ?COVID vaccine: done with 1 booster ? ?Concerns: ?Weight concerns- has a friend that is taking Qsymia and is interested in giving it a try since she heard it helps with weight loss and can give some energy.  ? ?Past medical, surgical, social and family history reviewed: ? ?Patient Active Problem List  ? Diagnosis Date Noted  ? Acute meniscal tear of left knee 12/03/2020  ? Postmenopausal disorder 09/20/2019  ? Centrilobular emphysema ([place]) 03/20/2019  ? Left lower lobe pulmonary nodule 03/20/2019  ? Atherosclerosis of aorta ([place]) 03/20/2019  ? Cough due to ACE inhibitor 02/06/2019  ? Prediabetes 01/26/2019  ? Hypertension goal BP (blood pressure) < 130/80 01/26/2019  ? Uncontrolled stage 2 hypertension 01/26/2019  ? Chronic obstructive pulmonary disease ([place]) 01/26/2019  ? Family history of colon cancer 01/26/2019  ? Carpal tunnel syndrome of left wrist 01/26/2019  ? Family history of dissection of thoracic aorta 01/26/2019  ? History of colon polyps 01/26/2019  ? Skin lesion 01/22/2019  ? Benign essential hypertension 01/03/2019  ? Plantar fasciitis, right 01/03/2019  ? Venous stasis dermatitis of right lower extremity 01/03/2019  ? Leiomyoma of leg 01/03/2019  ? Varicose veins  with pain 12/29/2015  ? Depression 05/03/2011  ? Hyperlipidemia, unspecified 05/03/2011  ? Low back pain 05/03/2011  ? Obesity (BMI 35.0-39.9 without comorbidity) 05/03/2011  ? ? ?Past Surgical History:  ?Procedure Laterality Date  ? ABDOMINAL HYSTERECTOMY    ? BREAST BIOPSY Left   ? CARPAL TUNNEL RELEASE Right   ? CERVICAL FUSION  2002  ? [place]  ? OOPHORECTOMY    ? TUBAL LIGATION    ? ? ?Social History  ? ?Tobacco Use  ? Smoking status: Former  ?  Packs/day: 2.00  ?  Years: 15.00  ?  Pack years: 30.00  ?  Types: Cigarettes  ?  Quit date: 10/10/2010  ?  Years since quitting: 11.1  ? Smokeless tobacco: Never  ?Substance Use Topics  ? Alcohol use: Not Currently  ? ? ?Family History  ?Problem Relation Age of Onset  ? Stroke Mother   ? AAA (abdominal aortic aneurysm) Mother   ? Early death Mother   ? Hypertension Mother   ? Colon cancer Brother   ? Ovarian cancer Daughter   ? Cancer Neg Hx   ? Birth defects Neg Hx   ? Diabetes Neg Hx   ?  ? ?Current medication list and allergy/intolerance information reviewed:   ? ?Current Outpatient Medications  ?Medication Sig Dispense Refill  ? aspirin EC 81 MG tablet Take 1 tablet (81 mg total) by mouth daily. 90 tablet 3  ? atorvastatin (LIPITOR) 20 MG tablet Take 1 tablet (20 mg total) by mouth daily. 90 tablet 3  ? chlorthalidone (HYGROTON) 25 MG tablet Take 1 tablet (25 mg total) by mouth daily. 90 tablet 3  ? estradiol (ESTRACE) 0.5 MG tablet TAKE TWO TABLETS  BY MOUTH ONE TIME DAILY 180 tablet 0  ? losartan (COZAAR) 50 MG tablet Take 1 tablet (50 mg total) by mouth daily. 90 tablet 3  ? meloxicam (MOBIC) 15 MG tablet Take 1 tablet (15 mg total) by mouth daily. 90 tablet 3  ? Phentermine-Topiramate 3.75-23 MG CP24 Take 1 tablet by mouth every morning. 14 capsule 0  ? ?No current facility-administered medications for this visit.  ? ? ?Allergies  ?Allergen Reactions  ? Darvon [Propoxyphene] Other (See Comments)  ?  Intolerance ?  ? Methadone Other (See  Comments)  ?  Intolerance  ? Naproxen Nausea Only  ?  GI Upset  ? ?  ? ?Review of Systems: ?Constitutional:  No  fever, no chills, No recent illness, No unintentional weight changes. No significant fatigue.  ?HEENT: No  headache, no vision change, no hearing change, No sore throat, No  sinus pressure ?Cardiac: No  chest pain, No  pressure, No palpitations, No  Orthopnea ?Respiratory:  No  shortness of breath. No  Cough ?Gastrointestinal: No  abdominal pain, No  nausea, No  vomiting,  No  blood in stool, No  diarrhea, No  constipation  ?Musculoskeletal: No new myalgia/arthralgia ?Skin: No  Rash, No other wounds/concerning lesions ?Genitourinary: No  incontinence, No  abnormal genital bleeding, No abnormal genital discharge ?Hem/Onc: No  easy bruising/bleeding, No  abnormal lymph node ?Endocrine: No cold intolerance,  No heat intolerance. No polyuria/polydipsia/polyphagia  ?Neurologic: No  weakness, No  dizziness, No  slurred speech/focal weakness/facial droop ?Psychiatric: No  concerns with depression, No  concerns with anxiety, No sleep problems, No mood problems ? ?Exam:  ?BP 129/73   Pulse (!) 55   Resp 20   Ht '5\' 5"'$  (1.651 m)   Wt 215 lb 3.2 oz (97.6 kg)   SpO2 96%   BMI 35.81 kg/m?  ?Constitutional: VS see above. General Appearance: alert, well-developed, well-nourished, NAD ?Eyes: Normal lids and conjunctive, non-icteric sclera ?Ears, Nose, Mouth, Throat: MMM, Normal external inspection ears/nares/mouth/lips/gums. TM normal bilaterally.   ?Neck: No masses, trachea midline. No thyroid enlargement. No tenderness/mass appreciated. No lymphadenopathy ?Respiratory: Normal respiratory effort. no wheeze, no rhonchi, no rales ?Cardiovascular: S1/S2 normal, no murmur, no rub/gallop auscultated. RRR. No lower extremity edema. Pedal pulse II/IV bilaterally PT. No carotid bruit or JVD. No abdominal aortic bruit. ?Gastrointestinal: Nontender, no masses. No hepatomegaly, no splenomegaly. No hernia appreciated. Bowel  sounds normal. Rectal exam deferred.  ?Musculoskeletal: Gait normal. No clubbing/cyanosis of digits.  ?Neurological: Normal balance/coordination. No tremor. No cranial nerve deficit on limited exam. Motor and sensation intact and symmetric. Cerebellar reflexes intact.  ?Skin: warm, dry, intact. No rash/ulcer. No concerning nevi or subq nodules on limited exam.   ?Psychiatric: Normal judgment/insight. Normal mood and affect. Oriented x3.  ? ? ?ASSESSMENT/PLAN:  ? ?1. Annual physical exam ?Checking labs as below.  Up-to-date on preventative care.  Wellness information provided with AVS. ?- CBC with Differential/Platelet ?- COMPLETE METABOLIC PANEL WITH GFR ?- Lipid panel ? ?2. Benign essential hypertension ?Blood pressure at goal today.  Continue losartan 50 mg daily and chlorthalidone 25 mg daily as prescribed. ?- CBC with Differential/Platelet ?- Lipid panel ? ?3. Centrilobular emphysema ([place]) ?4. Chronic obstructive pulmonary disease, unspecified COPD type ([place]) ?Doing well overall with no complaints of shortness of breath.  Oxygen level 96% on room air. ? ?5. Persistent depressive disorder ?Doing well overall.  No concerns. ? ?7. Atherosclerosis of aorta ([facility])6. Hyperlipidemia, unspecified hyperlipidemia type ? ?Checking lipid panel today.  Continue  atorvastatin 20 mg daily. ?- Lipid panel ? ?8. Prediabetes ?Checking hemoglobin A1c. ?- Hemoglobin A1c ? ?9. Weight gain ?Checking TSH.  Discussed recommendations for lifestyle modifications including regular intentional exercise and a lower calorie diet.  Discussed Qsymia, the indications, the safety data, potential side effects, and insurance considerations.  She would like to go ahead and try this so sending in the lowest dose of Qsymia to see if we can get this approved with insurance.  We will plan to follow-up with her in approximately 3 months to see how she is doing on the medication and her progress with weight loss. ?- TSH ? ? ?Orders Placed This Encounter   ?Procedures  ? CBC with Differential/Platelet  ? COMPLETE METABOLIC PANEL WITH GFR  ? Lipid panel  ? TSH  ? Hemoglobin A1c  ? ? ?Meds ordered this encounter  ?Medications  ? Phentermine-Topiramate 3.75-23 MG CP24  ?

## 2021-11-16 ENCOUNTER — Encounter: Payer: Self-pay | Admitting: Medical-Surgical

## 2021-11-16 ENCOUNTER — Ambulatory Visit (INDEPENDENT_AMBULATORY_CARE_PROVIDER_SITE_OTHER): Payer: BC Managed Care – PPO | Admitting: Medical-Surgical

## 2021-11-16 ENCOUNTER — Other Ambulatory Visit: Payer: Self-pay

## 2021-11-16 VITALS — BP 129/73 | HR 55 | Resp 20 | Ht 65.0 in | Wt 215.2 lb

## 2021-11-16 DIAGNOSIS — R635 Abnormal weight gain: Secondary | ICD-10-CM

## 2021-11-16 DIAGNOSIS — J432 Centrilobular emphysema: Secondary | ICD-10-CM

## 2021-11-16 DIAGNOSIS — R7303 Prediabetes: Secondary | ICD-10-CM

## 2021-11-16 DIAGNOSIS — Z0000 Encounter for general adult medical examination without abnormal findings: Secondary | ICD-10-CM | POA: Diagnosis not present

## 2021-11-16 DIAGNOSIS — J449 Chronic obstructive pulmonary disease, unspecified: Secondary | ICD-10-CM

## 2021-11-16 DIAGNOSIS — I700 Atherosclerosis of aorta: Secondary | ICD-10-CM

## 2021-11-16 DIAGNOSIS — I10 Essential (primary) hypertension: Secondary | ICD-10-CM

## 2021-11-16 DIAGNOSIS — E785 Hyperlipidemia, unspecified: Secondary | ICD-10-CM

## 2021-11-16 DIAGNOSIS — F341 Dysthymic disorder: Secondary | ICD-10-CM

## 2021-11-16 MED ORDER — PHENTERMINE-TOPIRAMATE ER 3.75-23 MG PO CP24: 1.0000 | ORAL_CAPSULE | Freq: Every morning | ORAL | 0 refills | Status: DC

## 2021-11-17 ENCOUNTER — Encounter: Payer: Self-pay | Admitting: Medical-Surgical

## 2021-11-17 LAB — HEMOGLOBIN A1C
Hgb A1c MFr Bld: 6 % of total Hgb — ABNORMAL HIGH (ref <5.7)
Mean Plasma Glucose: 126 mg/dL
eAG (mmol/L): 7 mmol/L

## 2021-11-17 LAB — COMPLETE METABOLIC PANEL WITH GFR
AG Ratio: 1.1 (calc) (ref 1.0–2.5)
ALT: 15 U/L (ref 6–29)
AST: 15 U/L (ref 10–35)
Albumin: 4.1 g/dL (ref 3.6–5.1)
Alkaline phosphatase (APISO): 68 U/L (ref 37–153)
BUN: 22 mg/dL (ref 7–25)
CO2: 29 mmol/L (ref 20–32)
Calcium: 9.4 mg/dL (ref 8.6–10.4)
Chloride: 102 mmol/L (ref 98–110)
Creat: 0.83 mg/dL (ref 0.50–1.05)
Globulin: 3.6 g/dL (calc) (ref 1.9–3.7)
Glucose, Bld: 97 mg/dL (ref 65–99)
Potassium: 3.9 mmol/L (ref 3.5–5.3)
Sodium: 139 mmol/L (ref 135–146)
Total Bilirubin: 0.5 mg/dL (ref 0.2–1.2)
Total Protein: 7.7 g/dL (ref 6.1–8.1)
eGFR: 80 mL/min/{1.73_m2} (ref >=60)

## 2021-11-17 LAB — CBC WITH DIFFERENTIAL/PLATELET
Absolute Monocytes: 520 cells/uL (ref 200–950)
Basophils Absolute: 52 cells/uL (ref 0–200)
Basophils Relative: 0.8 %
Eosinophils Absolute: 273 cells/uL (ref 15–500)
Eosinophils Relative: 4.2 %
HCT: 40.9 % (ref 35.0–45.0)
Hemoglobin: 13.7 g/dL (ref 11.7–15.5)
Lymphs Abs: 2373 cells/uL (ref 850–3900)
MCH: 30.4 pg (ref 27.0–33.0)
MCHC: 33.5 g/dL (ref 32.0–36.0)
MCV: 90.9 fL (ref 80.0–100.0)
MPV: 9.3 fL (ref 7.5–12.5)
Monocytes Relative: 8 %
Neutro Abs: 3283 cells/uL (ref 1500–7800)
Neutrophils Relative %: 50.5 %
Platelets: 281 10*3/uL (ref 140–400)
RBC: 4.5 10*6/uL (ref 3.80–5.10)
RDW: 12.9 % (ref 11.0–15.0)
Total Lymphocyte: 36.5 %
WBC: 6.5 10*3/uL (ref 3.8–10.8)

## 2021-11-17 LAB — LIPID PANEL
Cholesterol: 159 mg/dL (ref <200)
HDL: 46 mg/dL — ABNORMAL LOW (ref >=50)
LDL Cholesterol (Calc): 94 mg/dL (calc)
Non-HDL Cholesterol (Calc): 113 mg/dL (calc) (ref <130)
Total CHOL/HDL Ratio: 3.5 (calc) (ref <5.0)
Triglycerides: 93 mg/dL (ref <150)

## 2021-11-17 LAB — TSH: TSH: 1.62 mIU/L (ref 0.40–4.50)

## 2021-11-20 ENCOUNTER — Telehealth: Payer: Self-pay

## 2021-11-20 NOTE — Telephone Encounter (Addendum)
Initiated Prior authorization FRH:[id number] 3.75-'23MG'$  er capsules ?Via: Covermymeds ?Case/Key:[id number] ?Status: approved as of 11/20/21 ?Reason:this request is approved for the following time period: ?11/20/2021 - 02/20/2022 ?Notified Pt via: Mychart ?

## 2021-11-25 ENCOUNTER — Other Ambulatory Visit: Payer: Self-pay | Admitting: Medical-Surgical

## 2021-11-25 ENCOUNTER — Encounter: Payer: Self-pay | Admitting: Medical-Surgical

## 2021-11-25 MED ORDER — PHENTERMINE-TOPIRAMATE ER 7.5-46 MG PO CP24: 1.0000 | ORAL_CAPSULE | Freq: Every morning | ORAL | 0 refills | Status: DC

## 2021-11-26 ENCOUNTER — Ambulatory Visit (INDEPENDENT_AMBULATORY_CARE_PROVIDER_SITE_OTHER): Payer: BC Managed Care – PPO

## 2021-11-26 DIAGNOSIS — Z1231 Encounter for screening mammogram for malignant neoplasm of breast: Secondary | ICD-10-CM

## 2021-12-07 ENCOUNTER — Other Ambulatory Visit: Payer: Self-pay | Admitting: Medical-Surgical

## 2021-12-10 NOTE — Telephone Encounter (Signed)
It sounded like a back order issue with Annette Ayala's message, but does this need to be sent for the next dosage? ?

## 2022-01-11 ENCOUNTER — Other Ambulatory Visit: Payer: Self-pay | Admitting: Medical-Surgical

## 2022-02-09 NOTE — Telephone Encounter (Signed)
Spoke with patient and she will keep her appointment as scheduled.

## 2022-02-12 ENCOUNTER — Other Ambulatory Visit: Payer: Self-pay

## 2022-02-12 ENCOUNTER — Other Ambulatory Visit: Payer: Self-pay | Admitting: Medical-Surgical

## 2022-02-12 MED ORDER — QSYMIA 7.5-46 MG PO CP24: 1.0000 | ORAL_CAPSULE | Freq: Every morning | ORAL | 0 refills | Status: DC

## 2022-02-15 ENCOUNTER — Encounter: Payer: Self-pay | Admitting: Medical-Surgical

## 2022-02-15 ENCOUNTER — Ambulatory Visit (INDEPENDENT_AMBULATORY_CARE_PROVIDER_SITE_OTHER): Payer: BC Managed Care – PPO | Admitting: Medical-Surgical

## 2022-02-15 VITALS — BP 124/77 | HR 57 | Resp 20 | Ht 65.0 in | Wt 194.0 lb

## 2022-02-15 DIAGNOSIS — R635 Abnormal weight gain: Secondary | ICD-10-CM

## 2022-02-15 MED ORDER — PHENTERMINE-TOPIRAMATE ER 11.25-69 MG PO CP24: 1.0000 | ORAL_CAPSULE | Freq: Every morning | ORAL | 0 refills | Status: DC

## 2022-03-19 ENCOUNTER — Other Ambulatory Visit: Payer: Self-pay | Admitting: Medical-Surgical

## 2022-03-19 NOTE — Telephone Encounter (Signed)
Last Office visit 02/15/2022  Follow up appointment scheduled for 05/18/2022

## 2022-03-25 ENCOUNTER — Other Ambulatory Visit: Payer: Self-pay

## 2022-03-25 NOTE — Progress Notes (Signed)
Refill of this medication was sent on 03/19/2022 with receipt confirmed by the pharmacy. Please have her contact her pharmacy regarding picking this up. PDMP reviewed and it has  not been filled or dispensed yet.  ___________________________________________ Clearnce Sorrel, DNP, APRN, FNP-BC Primary Care and [place]

## 2022-03-25 NOTE — Progress Notes (Signed)
Pt lvm requesting medication refill.   Return visit schedule for 05/18/2022.

## 2022-03-29 ENCOUNTER — Telehealth: Payer: Self-pay

## 2022-03-29 NOTE — Telephone Encounter (Signed)
[facility] needs a prior authorization on Qsymia.

## 2022-04-02 ENCOUNTER — Telehealth: Payer: Self-pay

## 2022-04-02 NOTE — Telephone Encounter (Signed)
[facility] and [place] called and left a message wanting the PA to be done.

## 2022-04-02 NOTE — Telephone Encounter (Addendum)
Initiated Prior authorization NID:POEUMP 3.75-23MG er capsules  Via: Covermymeds Case/Key: [id number] Status: denied as of 04/02/22 Reason:The [place] has approved criteria in place for coverage of this medication. We needed additional clinical information from your provider in order to make a decision to either approve or deny the request. We did not receive the additional clinical information within the time allowed to make the decision; therefore, the request is denied. The request is denied because: You do not meet the requirements of your plan. Current plan approved criteria covers this drug when you have completed at least 12 weeks of Qsymia 15 mg/92 mg and met any of these conditions: - You lost at least 5 percent of your body weight - You have continued to keep your initial 5 percent weight loss off Supporting documentation must be submitted. Your request has been denied based on the information we have Notified Pt via: Mychart

## 2022-04-02 NOTE — Telephone Encounter (Signed)
Patient advised.

## 2022-04-13 ENCOUNTER — Telehealth: Payer: Self-pay

## 2022-04-13 MED ORDER — ESTRADIOL 0.5 MG PO TABS: ORAL_TABLET | ORAL | 0 refills | Status: DC

## 2022-04-13 NOTE — Telephone Encounter (Signed)
Patient called requesting refill of estradiol and to check on the status of the PA for Qsymia.  Refill for estradiol sent to pharmacy.  Please follow up PA determination and advise pt of status.  Charyl Bigger, CMA

## 2022-05-17 NOTE — Progress Notes (Unsigned)
   Established Patient Office Visit  Subjective   Patient ID: Annette Ayala, female   DOB: 03/29/1961 Age: 61 y.o. MRN: [id number]   No chief complaint on file.   HPI Pleasant 61 year old female presenting today for follow-up on weight concerns.  Has been taking Qsymia 11.25-69 mg daily, tolerating well without side effects.   Objective:    There were no vitals filed for this visit.  Physical Exam   No results found for this or any previous visit (from the past 24 hour(s)).   {Labs (Optional):23779}  The 10-year ASCVD risk score (Arnett DK, et al., 2019) is: 5.9%   Values used to calculate the score:     Age: 31 years     Sex: Female     Is Non-Hispanic African American: Yes     Diabetic: No     Tobacco smoker: No     Systolic Blood Pressure: 932 mmHg     Is BP treated: Yes     HDL Cholesterol: 46 mg/dL     Total Cholesterol: 159 mg/dL   Assessment & Plan:   No problem-specific Assessment & Plan notes found for this encounter.   No follow-ups on file.  ___________________________________________ Clearnce Sorrel, DNP, APRN, FNP-BC Primary Care and [place]

## 2022-05-18 ENCOUNTER — Encounter: Payer: Self-pay | Admitting: Medical-Surgical

## 2022-05-18 ENCOUNTER — Ambulatory Visit: Payer: BC Managed Care – PPO | Admitting: Medical-Surgical

## 2022-05-18 VITALS — BP 142/71 | HR 47 | Resp 20 | Ht 65.0 in | Wt 199.1 lb

## 2022-05-18 DIAGNOSIS — I10 Essential (primary) hypertension: Secondary | ICD-10-CM | POA: Diagnosis not present

## 2022-05-18 DIAGNOSIS — Z23 Encounter for immunization: Secondary | ICD-10-CM

## 2022-05-18 DIAGNOSIS — R635 Abnormal weight gain: Secondary | ICD-10-CM

## 2022-05-18 MED ORDER — QSYMIA 11.25-69 MG PO CP24: 1.0000 | ORAL_CAPSULE | Freq: Every morning | ORAL | 2 refills | Status: DC

## 2022-05-20 ENCOUNTER — Encounter: Payer: Self-pay | Admitting: Medical-Surgical

## 2022-05-28 ENCOUNTER — Telehealth: Payer: Self-pay

## 2022-05-28 NOTE — Telephone Encounter (Signed)
Resubmission  Prior authorization for: Qsymia 11.25-69MG er capsules Via: Covermymeds Case/Key:[id number]  Status: denied  as of 05/28/22 Reason:The [place] has criteria in place for coverage of this medication. We needed additional clinical information from your provider in order to make a decision to either approve or deny the request. We did not receive the additional clinical information within the time allowed to make the decision; therefore, the request is denied. The request is denied because: You do not meet the requirements of your plan. Current plan approved criteria covers this drug when you have completed at least 12 weeks of Qsymia 15 mg/92 mg and met any of these conditions: - You lost at least 5 percent of your body weight - You have continued to keep your initial 5 percent weight loss off Supporting documentation must be submitted. Your request has been denied based on the information we have. Notified Pt via: Mychart

## 2022-05-31 ENCOUNTER — Other Ambulatory Visit: Payer: Self-pay

## 2022-05-31 ENCOUNTER — Other Ambulatory Visit: Payer: Self-pay | Admitting: Medical-Surgical

## 2022-05-31 MED ORDER — BUPROPION HCL ER (XL) 150 MG PO TB24: 150.0000 mg | ORAL_TABLET | ORAL | 0 refills | Status: DC

## 2022-05-31 NOTE — Progress Notes (Signed)
Please contact Annette Ayala to let her know that I sent in Wellbutrin 150 mg daily for her.  This went to the [facility] on file.  I did send her a MyChart message along with the medication prescription however I cannot seem to find this in the system anywhere.  Additionally make sure that she is aware to keep her follow-up as scheduled at the end of December and we can evaluate her tolerance of the medication and need for dose increase at that time. ___________________________________________ Clearnce Sorrel, DNP, APRN, FNP-BC Primary [facility] and [facility] [place]

## 2022-06-29 ENCOUNTER — Other Ambulatory Visit: Payer: Self-pay

## 2022-06-29 DIAGNOSIS — I10 Essential (primary) hypertension: Secondary | ICD-10-CM

## 2022-06-29 MED ORDER — CHLORTHALIDONE 25 MG PO TABS: 25.0000 mg | ORAL_TABLET | Freq: Every day | ORAL | 3 refills | Status: DC

## 2022-06-29 NOTE — Telephone Encounter (Signed)
Last prescribed by Dr Sheppard Coil.

## 2022-07-09 ENCOUNTER — Other Ambulatory Visit: Payer: Self-pay | Admitting: Medical-Surgical

## 2022-07-19 ENCOUNTER — Other Ambulatory Visit: Payer: Self-pay

## 2022-07-19 DIAGNOSIS — G8929 Other chronic pain: Secondary | ICD-10-CM

## 2022-07-19 DIAGNOSIS — E782 Mixed hyperlipidemia: Secondary | ICD-10-CM

## 2022-07-19 DIAGNOSIS — I10 Essential (primary) hypertension: Secondary | ICD-10-CM

## 2022-07-19 MED ORDER — MELOXICAM 15 MG PO TABS: 15.0000 mg | ORAL_TABLET | Freq: Every day | ORAL | 3 refills | Status: DC

## 2022-07-19 MED ORDER — LOSARTAN POTASSIUM 50 MG PO TABS: 50.0000 mg | ORAL_TABLET | Freq: Every day | ORAL | 3 refills | Status: DC

## 2022-07-19 MED ORDER — ATORVASTATIN CALCIUM 20 MG PO TABS: 20.0000 mg | ORAL_TABLET | Freq: Every day | ORAL | 3 refills | Status: DC

## 2022-07-21 ENCOUNTER — Telehealth (INDEPENDENT_AMBULATORY_CARE_PROVIDER_SITE_OTHER): Payer: BC Managed Care – PPO | Admitting: Family Medicine

## 2022-07-21 ENCOUNTER — Encounter: Payer: Self-pay | Admitting: Family Medicine

## 2022-07-21 DIAGNOSIS — U071 COVID-19: Secondary | ICD-10-CM

## 2022-07-21 MED ORDER — NIRMATRELVIR/RITONAVIR (PAXLOVID)TABLET: 3.0000 | ORAL_TABLET | Freq: Two times a day (BID) | ORAL | 0 refills | Status: AC

## 2022-07-21 NOTE — Progress Notes (Signed)
    Virtual Visit via Video Note  I connected with Annette Ayala on 07/21/22 at  1:50 PM EST by a video enabled telemedicine application and verified that I am speaking with the correct person using two identifiers.   I discussed the limitations of evaluation and management by telemedicine and the availability of in person appointments. The patient expressed understanding and agreed to proceed.  Patient location: at home Provider location: in office  Subjective:    CC:  No chief complaint on file.   HPI:  Yesterday started sweating which was unusual bc it was really cold. She then got some chills. Then last night vomited. That was really unusual for her. Then got a metallic taste in her mouth.  Tested pos this AM.  NO other URI sxs.  She is vaccine..  Past medical history, Surgical history, Family history not pertinant except as noted below, Social history, Allergies, and medications have been entered into the medical record, reviewed, and corrections made.    Objective:    General: Speaking clearly in complete sentences without any shortness of breath.  Alert and oriented x3.  Normal judgment. No apparent acute distress.    Impression and Recommendations:    Problem List Items Addressed This Visit   None Visit Diagnoses     COVID-19    -  Primary   Relevant Medications   nirmatrelvir/ritonavir EUA (PAXLOVID) 20 x 150 MG & 10 x '100MG'$  TABS     COVID-19-we discussed options because of her history of hypertension and COPD we discussed the possibility of using Paxlovid that her symptoms are fairly mild at this point so we also discussed that she could also not use the medication.  She would prefer to take it.  Prescription sent to pharmacy.  Recommend continue with symptomatic care discussed quarantining guidelines.  She will call if she develops any new or worsening symptoms.  Work note left upfront.  No orders of the defined types were placed in this encounter.   Meds  ordered this encounter  Medications   nirmatrelvir/ritonavir EUA (PAXLOVID) 20 x 150 MG & 10 x '100MG'$  TABS    Sig: Take 3 tablets by mouth 2 (two) times daily for 5 days. (Take nirmatrelvir 150 mg two tablets twice daily for 5 days and ritonavir 100 mg one tablet twice daily for 5 days) Patient GFR is 80    Dispense:  30 tablet    Refill:  0     I discussed the assessment and treatment plan with the patient. The patient was provided an opportunity to ask questions and all were answered. The patient agreed with the plan and demonstrated an understanding of the instructions.   The patient was advised to call back or seek an in-person evaluation if the symptoms worsen or if the condition fails to improve as anticipated.   Beatrice Lecher, MD

## 2022-08-17 ENCOUNTER — Encounter: Payer: Self-pay | Admitting: Medical-Surgical

## 2022-08-17 ENCOUNTER — Ambulatory Visit: Payer: BC Managed Care – PPO | Admitting: Medical-Surgical

## 2022-08-17 VITALS — BP 130/70 | HR 52 | Resp 20 | Ht 65.0 in | Wt 207.0 lb

## 2022-08-17 DIAGNOSIS — E669 Obesity, unspecified: Secondary | ICD-10-CM | POA: Diagnosis not present

## 2022-08-17 DIAGNOSIS — I10 Essential (primary) hypertension: Secondary | ICD-10-CM | POA: Diagnosis not present

## 2022-08-17 DIAGNOSIS — Z2911 Encounter for prophylactic immunotherapy for respiratory syncytial virus (RSV): Secondary | ICD-10-CM

## 2022-08-17 MED ORDER — BUPROPION HCL ER (XL) 300 MG PO TB24: 300.0000 mg | ORAL_TABLET | ORAL | 1 refills | Status: DC

## 2022-08-17 MED ORDER — ALBUTEROL SULFATE HFA 108 (90 BASE) MCG/ACT IN AERS: 2.0000 | INHALATION_SPRAY | Freq: Four times a day (QID) | RESPIRATORY_TRACT | 11 refills | Status: DC | PRN

## 2022-08-17 NOTE — Progress Notes (Signed)
Established Patient Office Visit  Subjective   Patient ID: Annette Ayala, female   DOB: 12/21/1960 Age: 61 y.o. MRN: [id number]   Chief Complaint  Patient presents with   Follow-up   Weight Check    HPI Pleasant 61 year old female presenting today for weight check on Wellbutrin 150 mg daily. Notes that the medication is well tolerated but she is not feeling the energy or appetite reduction that she did with phentermine.  She continues to try to make healthier choices and monitor her portion size.  No regular intentional exercise outside of chasing around a 21-monthold.  Notes that she had COVID a few weeks ago.  She took Paxlovid and had a fairly quick recovery.  No lingering symptoms noted.   Objective:    Vitals:   08/17/22 1013  BP: 130/70  Pulse: (!) 52  Resp: 20  Height: '5\' 5"'$  (1.651 m)  Weight: 207 lb 0.6 oz (93.9 kg)  SpO2: 97%  BMI (Calculated): 34.45    Physical Exam Vitals and nursing note reviewed.  Constitutional:      General: She is not in acute distress.    Appearance: Normal appearance. She is obese. She is not ill-appearing.  HENT:     Head: Normocephalic and atraumatic.  Cardiovascular:     Rate and Rhythm: Normal rate and regular rhythm.     Pulses: Normal pulses.     Heart sounds: Normal heart sounds.  Pulmonary:     Effort: Pulmonary effort is normal. No respiratory distress.     Breath sounds: Normal breath sounds. No wheezing, rhonchi or rales.  Skin:    General: Skin is warm and dry.  Neurological:     Mental Status: She is alert and oriented to person, place, and time.  Psychiatric:        Mood and Affect: Mood normal.        Behavior: Behavior normal.        Thought Content: Thought content normal.        Judgment: Judgment normal.   No results found for this or any previous visit (from the past 24 hour(s)).     The 10-year ASCVD risk score (Arnett DK, et al., 2019) is: 7%   Values used to calculate the score:     Age: [age] years     Sex: Female     Is Non-Hispanic African American: Yes     Diabetic: No     Tobacco smoker: No     Systolic Blood Pressure: 1865mmHg     Is BP treated: Yes     HDL Cholesterol: 46 mg/dL     Total Cholesterol: 159 mg/dL   Assessment & Plan:   1. Obesity (BMI 35.0-39.9 without comorbidity) Discussed expectations with the patient.  Wellbutrin is not going to give at the same "boost" as phentermine well so it may seem like it is not helping much.  She would like to try an increased dose so bumping Wellbutrin to 300 mg daily.  2. Benign essential hypertension Blood pressure looks great.  Continue losartan 50 mg daily and chlorthalidone 25 mg daily.  Low-sodium diet.  Recommend regular intentional exercise at least 3 times weekly outside of chasing a toddler.  3. Need for RSV immunization Notable history of COPD.  RSV vaccine given in office today. - RSV,Recombinant PF (Arexvy)   Return in about 3 months (around 11/16/2022) for weight check.  ___________________________________________ JClearnce Sorrel DNP, APRN, FNP-BC Primary Care and Sports  [place]

## 2022-09-14 ENCOUNTER — Encounter: Payer: Self-pay | Admitting: Family Medicine

## 2022-09-14 ENCOUNTER — Telehealth (INDEPENDENT_AMBULATORY_CARE_PROVIDER_SITE_OTHER): Payer: BC Managed Care – PPO | Admitting: Family Medicine

## 2022-09-14 DIAGNOSIS — U071 COVID-19: Secondary | ICD-10-CM | POA: Diagnosis not present

## 2022-09-14 MED ORDER — NIRMATRELVIR/RITONAVIR (PAXLOVID)TABLET: 3.0000 | ORAL_TABLET | Freq: Two times a day (BID) | ORAL | 0 refills | Status: AC

## 2022-09-14 NOTE — Progress Notes (Signed)
Pt did a home test this morning she states that her grand baby had Covid last week and that is how she contracted it.   She stated that she was sweating, and coughing. She has only taken Mucinex for the cough.   Pt would like to get a note to Return to work on Monday. She has been out since yesterday.

## 2022-09-14 NOTE — Progress Notes (Signed)
    Virtual Visit via Video Note  I connected with Annette Ayala on 09/14/22 at  1:40 PM EST by a video enabled telemedicine application and verified that I am speaking with the correct person using two identifiers.   I discussed the limitations of evaluation and management by telemedicine and the availability of in person appointments. The patient expressed understanding and agreed to proceed.  Patient location: at home Provider location: in office  Subjective:    CC:   Chief Complaint  Patient presents with   Covid Positive    HPI:  Hx of COPD and HTN with new dx of COVID 27. + exp from grandchild.  She started feeling bad over the weekend.  Had a fever and sweats today.  Throat hurting.  + cough.    Past medical history, Surgical history, Family history not pertinant except as noted below, Social history, Allergies, and medications have been entered into the medical record, reviewed, and corrections made.    Objective:    General: Speaking clearly in complete sentences without any shortness of breath.  Alert and oriented x3.  Normal judgment. No apparent acute distress.   Impression and Recommendations:    Problem List Items Addressed This Visit   None Visit Diagnoses     COVID-19    -  Primary   Relevant Medications   nirmatrelvir/ritonavir (PAXLOVID) 20 x 150 MG & 10 x '100MG'$  TABS     COVID 19 -since she has had symptoms for 3 days at this point we will go ahead and treat with Paxlovid.  She actually just had around the Paxlovid right at the end of November.  Call if not improving or if getting worse or developing new symptoms.  She is higher risk based on her medical history.  Continue symptomatic care.  No orders of the defined types were placed in this encounter.   Meds ordered this encounter  Medications   nirmatrelvir/ritonavir (PAXLOVID) 20 x 150 MG & 10 x '100MG'$  TABS    Sig: Take 3 tablets by mouth 2 (two) times daily for 5 days. (Take nirmatrelvir 150  mg two tablets twice daily for 5 days and ritonavir 100 mg one tablet twice daily for 5 days) Patient GFR is 80    Dispense:  30 tablet    Refill:  0     I discussed the assessment and treatment plan with the patient. The patient was provided an opportunity to ask questions and all were answered. The patient agreed with the plan and demonstrated an understanding of the instructions.   The patient was advised to call back or seek an in-person evaluation if the symptoms worsen or if the condition fails to improve as anticipated.  I spent 10 minutes on the day of the encounter to include pre-visit record review, face-to-face time with the patient and post visit ordering of test.  Beatrice Lecher, MD

## 2022-10-08 ENCOUNTER — Other Ambulatory Visit: Payer: Self-pay | Admitting: Medical-Surgical

## 2022-10-13 ENCOUNTER — Other Ambulatory Visit: Payer: Self-pay | Admitting: Medical-Surgical

## 2022-10-13 DIAGNOSIS — Z1231 Encounter for screening mammogram for malignant neoplasm of breast: Secondary | ICD-10-CM

## 2022-11-16 ENCOUNTER — Ambulatory Visit: Payer: BC Managed Care – PPO | Admitting: Medical-Surgical

## 2022-11-16 ENCOUNTER — Encounter: Payer: Self-pay | Admitting: Medical-Surgical

## 2022-11-16 VITALS — BP 134/71 | HR 55 | Resp 20 | Ht 65.0 in | Wt 201.8 lb

## 2022-11-16 DIAGNOSIS — N951 Menopausal and female climacteric states: Secondary | ICD-10-CM

## 2022-11-16 DIAGNOSIS — I10 Essential (primary) hypertension: Secondary | ICD-10-CM

## 2022-11-16 DIAGNOSIS — R0989 Other specified symptoms and signs involving the circulatory and respiratory systems: Secondary | ICD-10-CM | POA: Diagnosis not present

## 2022-11-16 DIAGNOSIS — K5900 Constipation, unspecified: Secondary | ICD-10-CM | POA: Insufficient documentation

## 2022-11-16 DIAGNOSIS — Z87891 Personal history of nicotine dependence: Secondary | ICD-10-CM | POA: Insufficient documentation

## 2022-11-16 DIAGNOSIS — U071 COVID-19: Secondary | ICD-10-CM

## 2022-11-16 DIAGNOSIS — Z6833 Body mass index (BMI) 33.0-33.9, adult: Secondary | ICD-10-CM

## 2022-11-16 DIAGNOSIS — E6609 Other obesity due to excess calories: Secondary | ICD-10-CM | POA: Diagnosis not present

## 2022-11-16 LAB — POCT INFLUENZA A/B
Influenza A, POC: NEGATIVE
Influenza B, POC: NEGATIVE

## 2022-11-16 LAB — POC COVID19 BINAXNOW: SARS Coronavirus 2 Ag: POSITIVE — AB

## 2022-11-16 MED ORDER — LINACLOTIDE 72 MCG PO CAPS: 72.0000 ug | ORAL_CAPSULE | Freq: Every day | ORAL | 2 refills | Status: AC

## 2022-11-16 MED ORDER — BUPROPION HCL ER (XL) 300 MG PO TB24: 300.0000 mg | ORAL_TABLET | ORAL | 1 refills | Status: DC

## 2022-11-16 MED ORDER — ESTRADIOL 0.5 MG PO TABS: 1.0000 mg | ORAL_TABLET | Freq: Every day | ORAL | 1 refills | Status: DC

## 2022-11-16 MED ORDER — LINACLOTIDE 145 MCG PO CAPS: 145.0000 ug | ORAL_CAPSULE | Freq: Every day | ORAL | 2 refills | Status: DC

## 2022-11-16 NOTE — Assessment & Plan Note (Signed)
Stable on estradiol 1 mg daily.  Reports symptoms are well-managed and is requesting refill.  Refill sent to pharmacy.

## 2022-11-16 NOTE — Assessment & Plan Note (Signed)
She is a former smoker, quit approximately 12 years ago.  Started smoking at 62 years old and smoked up to 2 packs daily for many years.  She does meet the criteria for lung cancer screening and her last one was completed in 2020.  Reordering low-dose CT chest to get her updated.

## 2022-11-16 NOTE — Assessment & Plan Note (Signed)
She has been working on weight loss for quite a few months.  Unfortunately, this seems to be slow going.  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  She does not do any outside exercise.  Has been working on portion control and choosing healthier options.  We finally started on Wellbutrin 300 mg daily which seems to be helping with a little more energy as well as reducing cravings.  Plans to continue efforts to lose weight.  Total weight loss over the last year approximately 14 pounds.

## 2022-11-16 NOTE — Assessment & Plan Note (Signed)
Reports sudden onset of upper respiratory symptoms on Saturday including sinus congestion, nonproductive cough, mild shortness of breath, sinus pressure.  No headache, fever, chills, sore throat, or GI symptoms.  Endorses generalized malaise and decreased appetite.  Has not tried any over-the-counter medications for management of symptoms.  Testing for COVID and influenza today.

## 2022-11-16 NOTE — Assessment & Plan Note (Signed)
Taking losartan 50 mg daily, tolerating well without side effects.  Also on chlorthalidone 25 mg daily which she does well with.  Not regularly checking blood pressure at home and does still eat some salt in her foods.  No intentional exercise at this time but does stay busy throughout the day.  No abnormal heart sounds.  Heart rate and rhythm normal.  No edema or other concerning symptoms noted.  Recommend checking blood pressure at home at least 3 times weekly with a goal of 130/80 or less.  Low-sodium diet and regular intentional exercise are highly recommended for additional management of blood pressure.  Her reading today is 134/71 so she is controlled at this point.  Continue current medications as prescribed.

## 2022-11-16 NOTE — Addendum Note (Signed)
Addended bySamuel Bouche on: 11/16/2022 08:54 PM   Modules accepted: Orders

## 2022-11-16 NOTE — Assessment & Plan Note (Signed)
Has had a long history of dealing with idiopathic constipation.  She has tried multiple things including increasing her water intake, adding vitamin C, increasing her fiber intake, fiber supplements, and MiraLAX.  Has had no luck with any of these.  Is interested in trialing Linzess to see if this may be more beneficial for her.  Discussed medication, dosing recommendations, and other conservative measures to treat constipation.  We will start Linzess at 72 mcg daily.

## 2022-11-16 NOTE — Assessment & Plan Note (Signed)
Swabs completed today showed positive for COVID-19.  Unfortunately, this is the third time she has had this in the last 4 to 5 months.  Her symptoms are mild and she would like to forego the addition of Paxlovid.  Recommend conservative treatment with over-the-counter medications for cold and flu symptoms.  Advised her to make sure to aim for brands that are designed for patients with high blood pressure to avoid exacerbation of her hypertension.  Discussed updated recommendations for quarantine.

## 2022-11-16 NOTE — Progress Notes (Signed)
Established patient visit  Brief history, exam, impression, and plan:  Respiratory symptoms Reports sudden onset of upper respiratory symptoms on Saturday including sinus congestion, nonproductive cough, mild shortness of breath, sinus pressure.  No headache, fever, chills, sore throat, or GI symptoms.  Endorses generalized malaise and decreased appetite.  Has not tried any over-the-counter medications for management of symptoms.  Testing for COVID and influenza today.  Postmenopausal disorder Stable on estradiol 1 mg daily.  Reports symptoms are well-managed and is requesting refill.  Refill sent to pharmacy.  Former smoker She is a former smoker, quit approximately 12 years ago.  Started smoking at 62 years old and smoked up to 2 packs daily for many years.  She does meet the criteria for lung cancer screening and her last one was completed in 2020.  Reordering low-dose CT chest to get her updated.  COVID-19 Swabs completed today showed positive for COVID-19.  Unfortunately, this is the third time she has had this in the last 4 to 5 months.  Her symptoms are mild and she would like to forego the addition of Paxlovid.  Recommend conservative treatment with over-the-counter medications for cold and flu symptoms.  Advised her to make sure to aim for brands that are designed for patients with high blood pressure to avoid exacerbation of her hypertension.  Discussed updated recommendations for quarantine.  Class 1 obesity due to excess calories with serious comorbidity and body mass index (BMI) of 33.0 to 33.9 in adult She has been working on weight loss for quite a few months.  Unfortunately, this seems to be slow going.  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  She does not do any outside exercise.  Has been working on portion control and choosing healthier options.  We finally started on Wellbutrin 300 mg daily which seems  to be helping with a little more energy as well as reducing cravings.  Plans to continue efforts to lose weight.  Total weight loss over the last year approximately 14 pounds.  Benign essential hypertension Taking losartan 50 mg daily, tolerating well without side effects.  Also on chlorthalidone 25 mg daily which she does well with.  Not regularly checking blood pressure at home and does still eat some salt in her foods.  No intentional exercise at this time but does stay busy throughout the day.  No abnormal heart sounds.  Heart rate and rhythm normal.  No edema or other concerning symptoms noted.  Recommend checking blood pressure at home at least 3 times weekly with a goal of 130/80 or less.  Low-sodium diet and regular intentional exercise are highly recommended for additional management of blood pressure.  Her reading today is 134/71 so she is controlled at this point.  Continue current medications as prescribed.  Constipation Has had a long history of dealing with idiopathic constipation.  She has tried multiple things including increasing her water intake, adding vitamin C, increasing her fiber intake, fiber supplements, and MiraLAX.  Has had no luck with any of these.  Is interested in trialing Linzess to see if this may be more beneficial for her.  Discussed medication, dosing recommendations, and other conservative measures to treat constipation.  We will start Linzess at 72 mcg daily.   Procedures performed this visit: None.  Return in about 6 months (around 05/19/2023) for HTN/weight follow up.  __________________________________ Clearnce Sorrel, DNP, APRN,  FNP-BC [facility]

## 2022-11-22 ENCOUNTER — Ambulatory Visit (INDEPENDENT_AMBULATORY_CARE_PROVIDER_SITE_OTHER): Payer: BC Managed Care – PPO

## 2022-11-22 DIAGNOSIS — Z87891 Personal history of nicotine dependence: Secondary | ICD-10-CM

## 2022-11-26 ENCOUNTER — Telehealth: Payer: Self-pay

## 2022-11-26 NOTE — Telephone Encounter (Signed)
LMVM advising her what you recommended.

## 2022-11-26 NOTE — Telephone Encounter (Signed)
We recommend patients do not retest for COVID as some continue to test positive for weeks to months after the initial infection. Despite still testing positive, once fever free with improvement in symptoms, they are no longer contagious.

## 2022-11-26 NOTE — Telephone Encounter (Signed)
Patient called wanting to know if she can come in next Tuesday to get retested for COVID because she has been doing home COVID tests and they are still showing Positive.

## 2022-12-01 ENCOUNTER — Ambulatory Visit (INDEPENDENT_AMBULATORY_CARE_PROVIDER_SITE_OTHER): Payer: BC Managed Care – PPO

## 2022-12-01 DIAGNOSIS — Z1231 Encounter for screening mammogram for malignant neoplasm of breast: Secondary | ICD-10-CM | POA: Diagnosis not present

## 2023-03-31 ENCOUNTER — Encounter: Payer: Self-pay | Admitting: Medical-Surgical

## 2023-05-19 ENCOUNTER — Encounter: Payer: Self-pay | Admitting: Medical-Surgical

## 2023-05-19 ENCOUNTER — Ambulatory Visit: Payer: BC Managed Care – PPO | Admitting: Medical-Surgical

## 2023-05-19 VITALS — BP 124/63 | HR 53 | Resp 20 | Ht 65.0 in | Wt 198.9 lb

## 2023-05-19 DIAGNOSIS — E785 Hyperlipidemia, unspecified: Secondary | ICD-10-CM

## 2023-05-19 DIAGNOSIS — I10 Essential (primary) hypertension: Secondary | ICD-10-CM

## 2023-05-19 DIAGNOSIS — I700 Atherosclerosis of aorta: Secondary | ICD-10-CM | POA: Diagnosis not present

## 2023-05-19 DIAGNOSIS — R7303 Prediabetes: Secondary | ICD-10-CM

## 2023-05-19 DIAGNOSIS — J432 Centrilobular emphysema: Secondary | ICD-10-CM | POA: Diagnosis not present

## 2023-05-19 DIAGNOSIS — Z23 Encounter for immunization: Secondary | ICD-10-CM

## 2023-05-19 DIAGNOSIS — F341 Dysthymic disorder: Secondary | ICD-10-CM

## 2023-05-19 NOTE — Progress Notes (Signed)
        Established patient visit  History, exam, impression, and plan:  1. Benign essential hypertension Pleasant 62 year old female presenting today with a history of hypertension that is currently managed with losartan 50 mg daily and chlorthalidone 25 mg daily.  Tolerating both medications well without side effects.  Not regularly checking her blood pressure at home.  Does follow a low-sodium diet as much as possible.  Staying regularly active but not participating in any intentional exercise.  Denies concerning symptoms today.  Benign cardio pulmonary exam.  Checking labs as below.  Blood pressure at goal.  Continue losartan and chlorthalidone as prescribed. - CBC - CMP14+EGFR - Lipid panel  2. Atherosclerosis of aorta (HCC) Aortic atherosclerosis identified on imaging.  Currently on atorvastatin for lipid-lowering therapy. - Lipid panel  3. Centrilobular emphysema (HCC) History of COPD with recent confirmation on imaging.  She is a former smoker and will be due to repeat her CT lung cancer screening in April 2025.  She did have a benign appearing nodule and we discussed this at length.  Because she has COPD, she is due for a pneumonia vaccine.  She cannot do it today as she has her 44-year-old with her but she will come back for this.  Advised to schedule as a nurse visit to get this taken care of.  She can have her labs drawn when she comes for her vaccine.  4. Prediabetes Not currently on any medications.  She is working on dietary modification.  Plan to recheck A1c with her other labs. - Hemoglobin A1c  5. Hyperlipidemia, unspecified hyperlipidemia type As noted above, she is on atorvastatin 20 mg daily.  Tolerating this well without side effects.  Following a low-fat heart healthy diet most of the time.  Checking labs as below.  Continue atorvastatin. - CMP14+EGFR - Lipid panel  6. Persistent depressive disorder She has a history of previous persistent depressive disorder that is  currently well-managed with Wellbutrin XL 300 mg daily.  Tolerating medication well without side effects.  Feels that her mood is stable and that she is doing well overall.  Mood, affect, thought pattern, cognition, and speech normal.  Denies SI/HI.  Continue Wellbutrin as prescribed.  7. Need for influenza vaccination Flu vaccine given in office today. - Flu vaccine trivalent PF, 6mos and older(Flulaval,Afluria,Fluarix,Fluzone)  8. Need for COVID-19 vaccine COVID-vaccine given in office today. - Pfizer Comirnaty Covid -[id number] Vaccine 49yrs and older   Procedures performed this visit: None.  Return in about 6 months (around 11/16/2023) for chronic disease follow up.  __________________________________ Thayer Ohm, DNP, APRN, FNP-BC Primary Care and Sports Medicine [facility] [place]

## 2023-05-20 NOTE — Telephone Encounter (Signed)
Patient should have a titer drawn to see if she actually needs the hepatitis B vaccine if she was vaccinated in the past. If she never had them, ok to start the 3 shot series.

## 2023-05-20 NOTE — Telephone Encounter (Signed)
Attempted call to patient. Voice mail not yet set up could not leave a voice mail messsage.

## 2023-05-23 ENCOUNTER — Other Ambulatory Visit: Payer: Self-pay

## 2023-05-23 DIAGNOSIS — I10 Essential (primary) hypertension: Secondary | ICD-10-CM

## 2023-05-23 DIAGNOSIS — I700 Atherosclerosis of aorta: Secondary | ICD-10-CM

## 2023-05-23 DIAGNOSIS — R7303 Prediabetes: Secondary | ICD-10-CM

## 2023-05-23 DIAGNOSIS — E785 Hyperlipidemia, unspecified: Secondary | ICD-10-CM

## 2023-05-23 DIAGNOSIS — Z0184 Encounter for antibody response examination: Secondary | ICD-10-CM

## 2023-05-25 ENCOUNTER — Ambulatory Visit (INDEPENDENT_AMBULATORY_CARE_PROVIDER_SITE_OTHER): Payer: BC Managed Care – PPO

## 2023-05-25 DIAGNOSIS — Z23 Encounter for immunization: Secondary | ICD-10-CM | POA: Diagnosis not present

## 2023-05-25 NOTE — Progress Notes (Signed)
Pneumonia 20 vaccine given without complications.

## 2023-05-25 NOTE — Addendum Note (Signed)
Addended by: Chalmers Cater on: 05/25/2023 08:10 AM   Modules accepted: Orders

## 2023-05-26 ENCOUNTER — Encounter: Payer: Self-pay | Admitting: Medical-Surgical

## 2023-05-26 LAB — CBC WITH DIFFERENTIAL/PLATELET
Basophils Absolute: 0.1 10*3/uL (ref 0.0–0.2)
Basos: 1 %
EOS (ABSOLUTE): 0.2 10*3/uL (ref 0.0–0.4)
Eos: 4 %
Hematocrit: 37.5 % (ref 34.0–46.6)
Hemoglobin: 12.6 g/dL (ref 11.1–15.9)
Immature Grans (Abs): 0 10*3/uL (ref 0.0–0.1)
Immature Granulocytes: 0 %
Lymphocytes Absolute: 2.6 10*3/uL (ref 0.7–3.1)
Lymphs: 43 %
MCH: 31.2 pg (ref 26.6–33.0)
MCHC: 33.6 g/dL (ref 31.5–35.7)
MCV: 93 fL (ref 79–97)
Monocytes Absolute: 0.5 10*3/uL (ref 0.1–0.9)
Monocytes: 8 %
Neutrophils Absolute: 2.7 10*3/uL (ref 1.4–7.0)
Neutrophils: 44 %
Platelets: 235 10*3/uL (ref 150–450)
RBC: 4.04 x10E6/uL (ref 3.77–5.28)
RDW: 11.7 % (ref 11.7–15.4)
WBC: 6 10*3/uL (ref 3.4–10.8)

## 2023-05-26 LAB — CMP14+EGFR
ALT: 18 [IU]/L (ref 0–32)
AST: 21 [IU]/L (ref 0–40)
Albumin: 4.4 g/dL (ref 3.9–4.9)
Alkaline Phosphatase: 64 [IU]/L (ref 44–121)
BUN/Creatinine Ratio: 21 (ref 12–28)
BUN: 22 mg/dL (ref 8–27)
Bilirubin Total: 0.5 mg/dL (ref 0.0–1.2)
CO2: 26 mmol/L (ref 20–29)
Calcium: 10 mg/dL (ref 8.7–10.3)
Chloride: 101 mmol/L (ref 96–106)
Creatinine, Ser: 1.04 mg/dL — ABNORMAL HIGH (ref 0.57–1.00)
Globulin, Total: 2.9 g/dL (ref 1.5–4.5)
Glucose: 97 mg/dL (ref 70–99)
Potassium: 3.7 mmol/L (ref 3.5–5.2)
Sodium: 141 mmol/L (ref 134–144)
Total Protein: 7.3 g/dL (ref 6.0–8.5)
eGFR: 61 mL/min/{1.73_m2} (ref >59)

## 2023-05-26 LAB — LIPID PANEL
Chol/HDL Ratio: 2.8 {ratio} (ref 0.0–4.4)
Cholesterol, Total: 149 mg/dL (ref 100–199)
HDL: 53 mg/dL (ref >39)
LDL Chol Calc (NIH): 83 mg/dL (ref 0–99)
Triglycerides: 65 mg/dL (ref 0–149)
VLDL Cholesterol Cal: 13 mg/dL (ref 5–40)

## 2023-05-26 LAB — HEMOGLOBIN A1C
Est. average glucose Bld gHb Est-mCnc: 120 mg/dL
Hgb A1c MFr Bld: 5.8 % — ABNORMAL HIGH (ref 4.8–5.6)

## 2023-05-26 LAB — HEPATITIS B SURFACE ANTIBODY, QUANTITATIVE: Hepatitis B Surf Ab Quant: 19.1 m[IU]/mL

## 2023-06-21 ENCOUNTER — Encounter: Payer: Self-pay | Admitting: Medical-Surgical

## 2023-06-21 NOTE — Telephone Encounter (Signed)
Patient states an appt had already been made for Nov 5th.

## 2023-06-21 NOTE — Telephone Encounter (Signed)
I called patient to schedule her for an appointment. She states she will call back to schedule.

## 2023-06-24 ENCOUNTER — Other Ambulatory Visit: Payer: Self-pay | Admitting: Medical-Surgical

## 2023-06-24 ENCOUNTER — Encounter: Payer: Self-pay | Admitting: Medical-Surgical

## 2023-06-24 DIAGNOSIS — I10 Essential (primary) hypertension: Secondary | ICD-10-CM

## 2023-06-24 MED ORDER — CHLORTHALIDONE 25 MG PO TABS: 25.0000 mg | ORAL_TABLET | Freq: Every day | ORAL | 1 refills | Status: DC

## 2023-06-24 MED ORDER — LOSARTAN POTASSIUM 50 MG PO TABS: 50.0000 mg | ORAL_TABLET | Freq: Every day | ORAL | 1 refills | Status: DC

## 2023-06-28 ENCOUNTER — Encounter: Payer: Self-pay | Admitting: Medical-Surgical

## 2023-06-28 ENCOUNTER — Ambulatory Visit: Payer: BC Managed Care – PPO | Admitting: Medical-Surgical

## 2023-06-28 VITALS — BP 142/69 | HR 52 | Resp 20 | Ht 65.0 in | Wt 200.0 lb

## 2023-06-28 DIAGNOSIS — F4322 Adjustment disorder with anxiety: Secondary | ICD-10-CM | POA: Diagnosis not present

## 2023-06-28 MED ORDER — BUSPIRONE HCL 5 MG PO TABS: 5.0000 mg | ORAL_TABLET | Freq: Three times a day (TID) | ORAL | 3 refills | Status: AC | PRN

## 2023-06-28 NOTE — Progress Notes (Signed)
        Established patient visit  History, exam, impression, and plan:  1. Adjustment disorder with anxiety Pleasant 62 year old female presenting today with reports of having difficulty with recent increase in stress and anxiety.  She is in a process of adopting her great granddaughter and now the courts have ordered every other weekend be allowed with her father as long as they are supervised.  Unfortunately her great granddaughter does not want to leave her when she takes her over to her father's house.  Her great granddaughter is now 45 years old, having tantrums, trouble sleeping, and yelling fits.  This has become very difficult to manage and Analese finds herself getting very stressed out and frustrated.  She is not currently doing any counseling and is not sure that this will be helpful.  Does not like to take medicine and would like to avoid adding an extra daily medication to her regimen.  Is interested in as needed options to help with stress/anxiety.  Would like to avoid controlled substances and anything that would cause excessive grogginess.  Discussed various options.  Feel that she would benefit greatly from a trial of BuSpar 5 mg 3 times daily as needed.  Patient is agreeable, sent to pharmacy.  Procedures performed this visit: None.  Return if symptoms worsen or fail to improve.  Return for follow-up appointment as scheduled.  Let me know how BuSpar is working via [employer] and 3-4 weeks.  __________________________________ Thayer Ohm, DNP, APRN, FNP-BC Primary Care and Sports Medicine [facility] [place]

## 2023-07-10 ENCOUNTER — Other Ambulatory Visit: Payer: Self-pay | Admitting: Medical-Surgical

## 2023-07-10 DIAGNOSIS — E782 Mixed hyperlipidemia: Secondary | ICD-10-CM

## 2023-07-18 ENCOUNTER — Other Ambulatory Visit: Payer: Self-pay | Admitting: Medical-Surgical

## 2023-07-18 DIAGNOSIS — G8929 Other chronic pain: Secondary | ICD-10-CM

## 2023-07-24 ENCOUNTER — Other Ambulatory Visit: Payer: Self-pay | Admitting: Medical-Surgical

## 2023-07-26 ENCOUNTER — Encounter: Payer: Self-pay | Admitting: Medical-Surgical

## 2023-09-05 ENCOUNTER — Encounter: Payer: Self-pay | Admitting: Medical-Surgical

## 2023-10-10 ENCOUNTER — Encounter: Payer: Self-pay | Admitting: Medical-Surgical

## 2023-10-15 ENCOUNTER — Other Ambulatory Visit: Payer: Self-pay | Admitting: Medical-Surgical

## 2023-10-15 DIAGNOSIS — E782 Mixed hyperlipidemia: Secondary | ICD-10-CM

## 2023-10-15 DIAGNOSIS — G8929 Other chronic pain: Secondary | ICD-10-CM

## 2023-10-19 ENCOUNTER — Other Ambulatory Visit: Payer: Self-pay | Admitting: Medical-Surgical

## 2023-10-19 DIAGNOSIS — E782 Mixed hyperlipidemia: Secondary | ICD-10-CM

## 2023-10-19 MED ORDER — ATORVASTATIN CALCIUM 20 MG PO TABS: 20.0000 mg | ORAL_TABLET | Freq: Every day | ORAL | 0 refills | Status: DC

## 2023-10-19 MED ORDER — BUPROPION HCL ER (XL) 300 MG PO TB24: 300.0000 mg | ORAL_TABLET | Freq: Every morning | ORAL | 0 refills | Status: DC

## 2023-10-28 IMAGING — MG MM DIGITAL SCREENING BILAT W/ TOMO AND CAD
6 of 12 series · 6 of 36 positions shown · non-contrast
Comparison: Previous exam(s).

CLINICAL DATA: Screening.

EXAM:
DIGITAL SCREENING BILATERAL MAMMOGRAM WITH TOMOSYNTHESIS AND CAD
TECHNIQUE: Bilateral screening digital craniocaudal and mediolateral oblique
mammograms were obtained. Bilateral screening digital breast
tomosynthesis was performed. The images were evaluated with
computer-aided detection.

[L CC synth-2D]
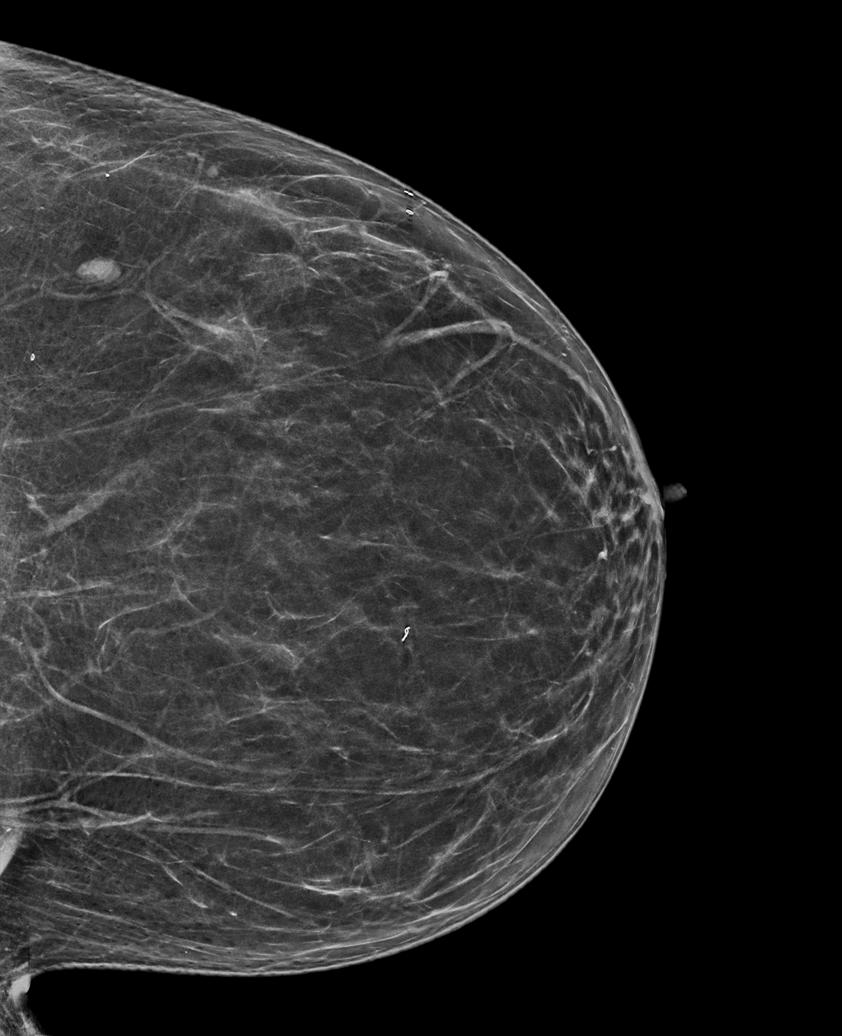

[R MLO synth-2D (1 of 2)]
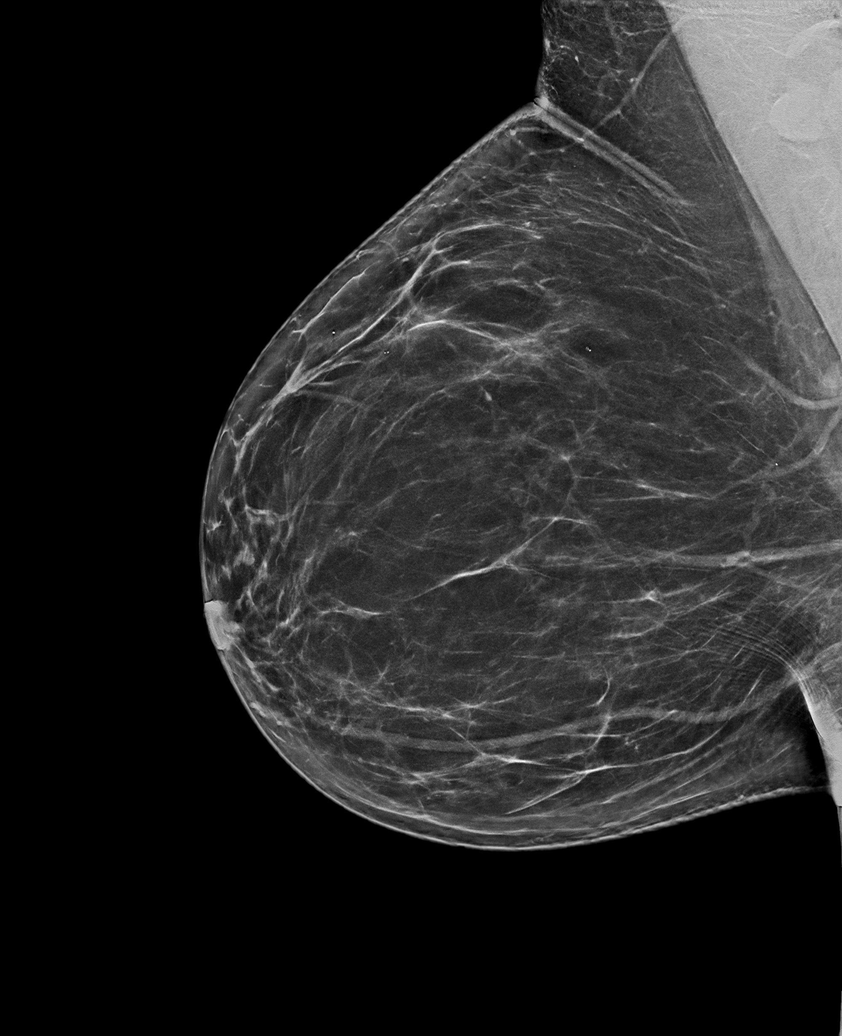

[L MLO synth-2D (1 of 2)]
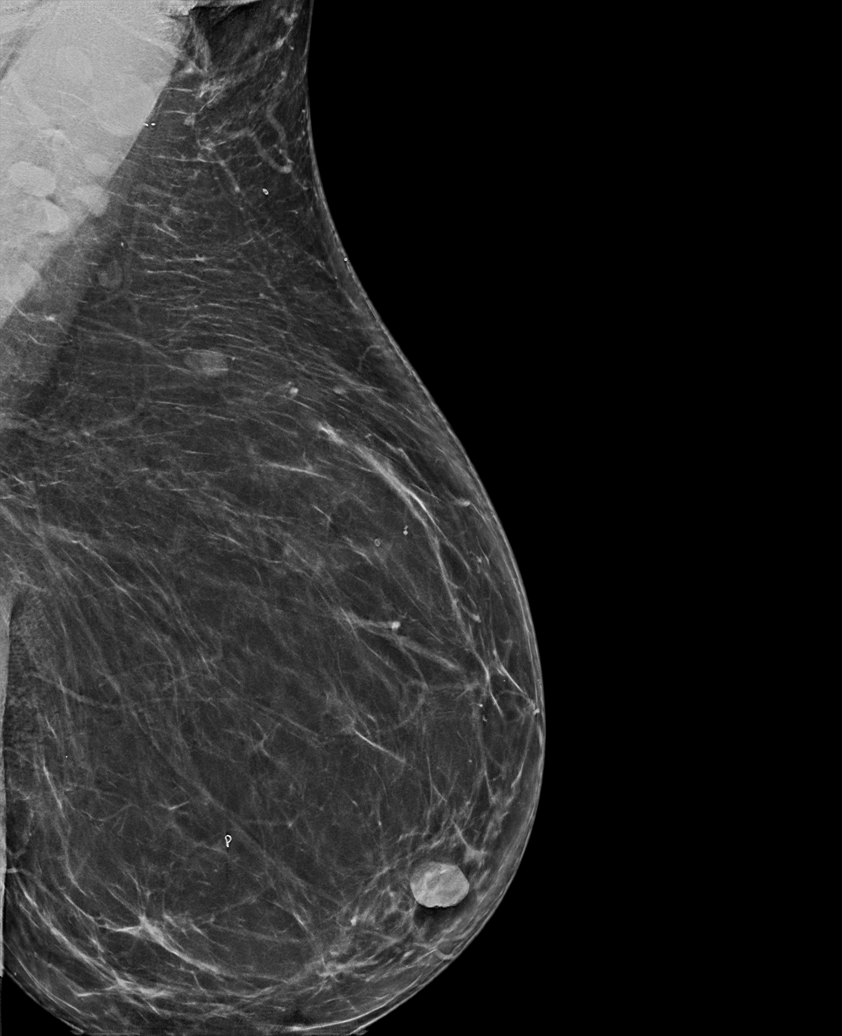

[R MLO synth-2D (2 of 2)]
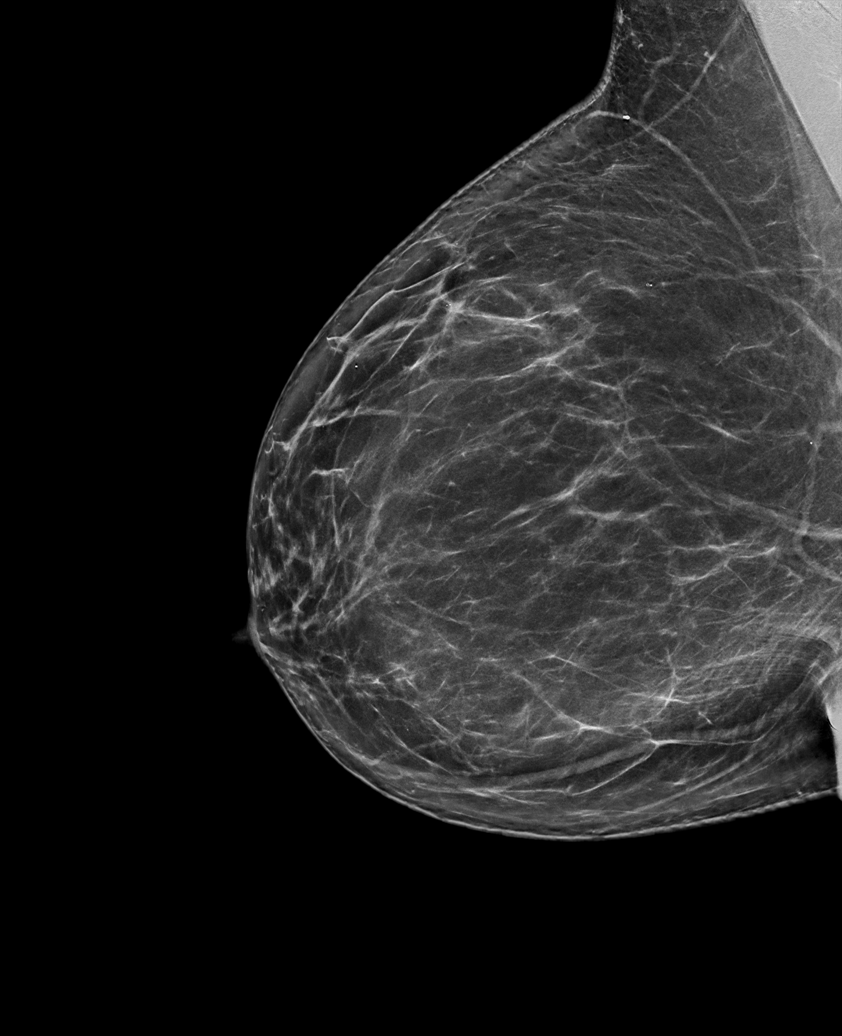

[R CC synth-2D]
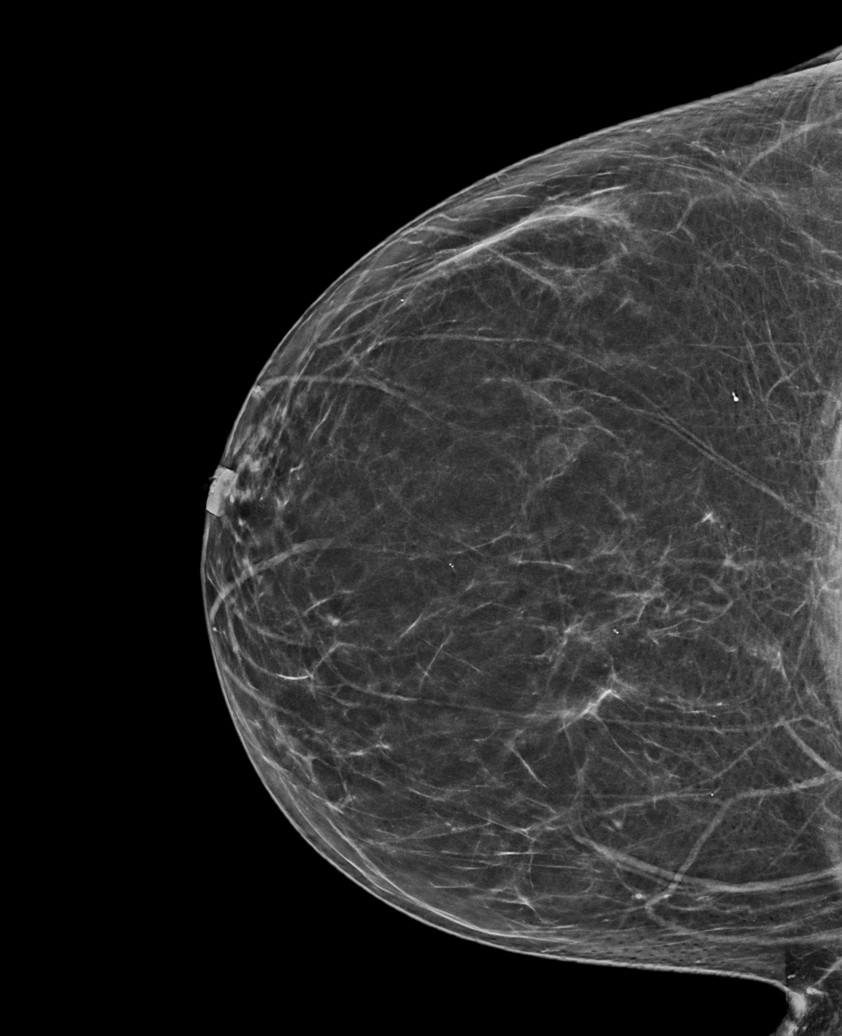

[L MLO synth-2D (2 of 2)]
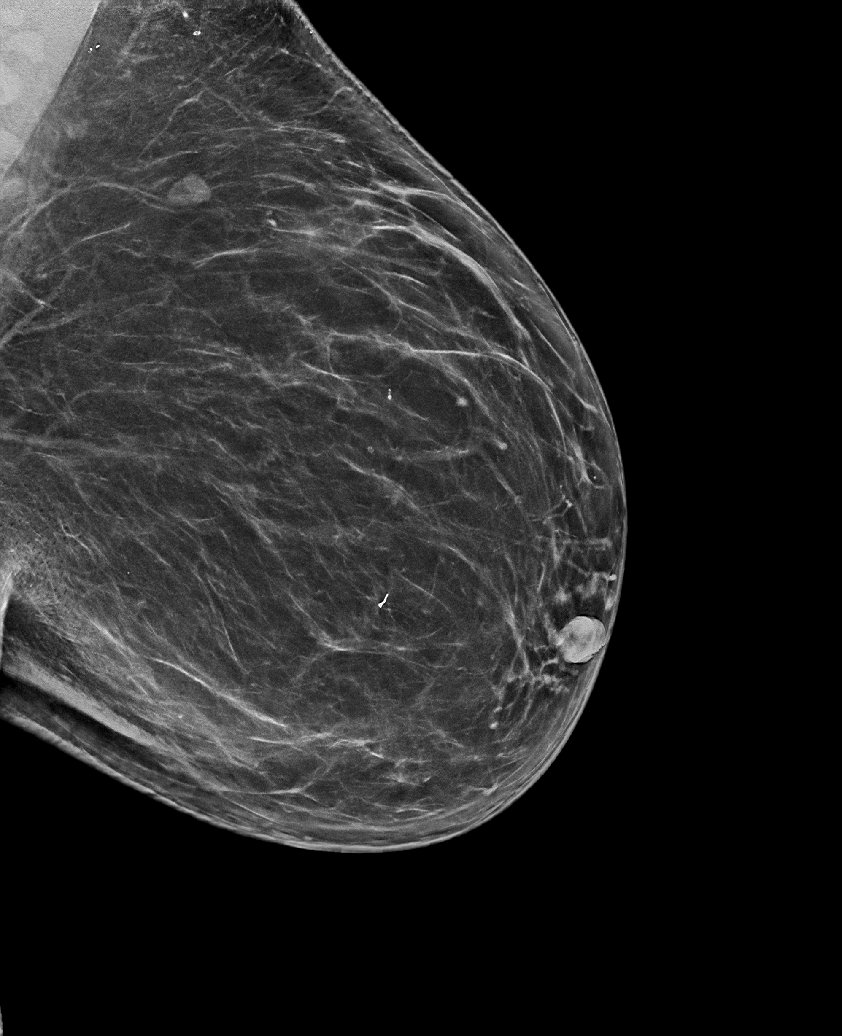

[6 of 36 positions shown; findings below may reference images not displayed]

ACR Breast Density Category b: There are scattered areas of
fibroglandular density.
FINDINGS: There are no findings suspicious for malignancy.
IMPRESSION: No mammographic evidence of malignancy. A result letter of this
screening mammogram will be mailed directly to the patient.

RECOMMENDATION:
Screening mammogram in one year. (Code:51-O-LD2)

BI-RADS CATEGORY  1: Negative.

## 2023-11-16 ENCOUNTER — Ambulatory Visit (INDEPENDENT_AMBULATORY_CARE_PROVIDER_SITE_OTHER): Payer: BC Managed Care – PPO | Admitting: Medical-Surgical

## 2023-11-16 ENCOUNTER — Encounter: Payer: Self-pay | Admitting: Medical-Surgical

## 2023-11-16 ENCOUNTER — Telehealth: Payer: Self-pay

## 2023-11-16 ENCOUNTER — Other Ambulatory Visit: Payer: Self-pay | Admitting: Medical-Surgical

## 2023-11-16 VITALS — BP 122/66 | HR 56 | Resp 20 | Ht 64.0 in | Wt 212.9 lb

## 2023-11-16 DIAGNOSIS — N289 Disorder of kidney and ureter, unspecified: Secondary | ICD-10-CM

## 2023-11-16 DIAGNOSIS — E66812 Obesity, class 2: Secondary | ICD-10-CM

## 2023-11-16 DIAGNOSIS — M25562 Pain in left knee: Secondary | ICD-10-CM

## 2023-11-16 DIAGNOSIS — E661 Drug-induced obesity: Secondary | ICD-10-CM

## 2023-11-16 DIAGNOSIS — G8929 Other chronic pain: Secondary | ICD-10-CM

## 2023-11-16 DIAGNOSIS — I10 Essential (primary) hypertension: Secondary | ICD-10-CM

## 2023-11-16 DIAGNOSIS — J449 Chronic obstructive pulmonary disease, unspecified: Secondary | ICD-10-CM | POA: Diagnosis not present

## 2023-11-16 DIAGNOSIS — R7303 Prediabetes: Secondary | ICD-10-CM | POA: Diagnosis not present

## 2023-11-16 DIAGNOSIS — Z1231 Encounter for screening mammogram for malignant neoplasm of breast: Secondary | ICD-10-CM

## 2023-11-16 DIAGNOSIS — E782 Mixed hyperlipidemia: Secondary | ICD-10-CM

## 2023-11-16 DIAGNOSIS — Z87891 Personal history of nicotine dependence: Secondary | ICD-10-CM

## 2023-11-16 DIAGNOSIS — Z6836 Body mass index (BMI) 36.0-36.9, adult: Secondary | ICD-10-CM

## 2023-11-16 LAB — POCT GLYCOSYLATED HEMOGLOBIN (HGB A1C): Hemoglobin A1C: 5.4 % (ref 4.0–5.6)

## 2023-11-16 MED ORDER — ATORVASTATIN CALCIUM 20 MG PO TABS
20.0000 mg | ORAL_TABLET | Freq: Every day | ORAL | 3 refills | Status: AC
Start: 2023-11-16 — End: ?

## 2023-11-16 MED ORDER — LOSARTAN POTASSIUM 50 MG PO TABS: 50.0000 mg | ORAL_TABLET | Freq: Every day | ORAL | 3 refills | Status: AC

## 2023-11-16 MED ORDER — BUPROPION HCL ER (XL) 300 MG PO TB24: 300.0000 mg | ORAL_TABLET | Freq: Every morning | ORAL | 3 refills | Status: AC

## 2023-11-16 MED ORDER — ALBUTEROL SULFATE HFA 108 (90 BASE) MCG/ACT IN AERS: 2.0000 | INHALATION_SPRAY | Freq: Four times a day (QID) | RESPIRATORY_TRACT | 11 refills | Status: AC | PRN

## 2023-11-16 MED ORDER — WEGOVY 0.25 MG/0.5ML ~~LOC~~ SOAJ: 0.2500 mg | SUBCUTANEOUS | 0 refills | Status: DC

## 2023-11-16 MED ORDER — MELOXICAM 15 MG PO TABS
15.0000 mg | ORAL_TABLET | Freq: Every day | ORAL | 3 refills | Status: AC
Start: 2023-11-16 — End: ?

## 2023-11-16 MED ORDER — CHLORTHALIDONE 25 MG PO TABS: 25.0000 mg | ORAL_TABLET | Freq: Every day | ORAL | 3 refills | Status: AC

## 2023-11-16 NOTE — Telephone Encounter (Signed)
 I just sent in the [facility] this afternoon.  It will likely need a prior authorization before we will know if the insurance will cover it and if so, how much of the cost will be. Thanks, Ander Slade

## 2023-11-16 NOTE — Telephone Encounter (Signed)
 A new referral would need to be sent, last one I see is 2024.

## 2023-11-16 NOTE — Progress Notes (Signed)
 Established patient visit  History, exam, impression, and plan:  1. Class 2 drug-induced obesity with serious comorbidity and body mass index (BMI) of 36.0 to 36.9 in adult (Primary) Very pleasant 63 year old female presenting today for follow-up on chronic diseases.  She is very concerned about her weight as this has been an ongoing theme.  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  She has been working on dietary modification but this is very hard given her busy schedule.  Previously tried multiple options but was unable to find anything that provided adequate weight loss and helped her maintain it.  She is very interested in trying for weight loss injections at this time.  Advised that insurance may not cover weight loss injections depending on her plan.  She falls in the class II obesity category and also has prediabetes, hyperlipidemia, and hypertension so she would meet the BMI and comorbidity qualification if this is a covered benefit.  She would like to go ahead and try to get coverage so sending in Wegovy 0.25 mg weekly. - WEGOVY 0.25 MG/0.5ML SOAJ; Inject 0.25 mg into the skin once a week. Use this dose for 1 month (4 shots) and then increase to next higher dose.  Dispense: 2 mL; Refill: 0  2. Benign essential hypertension History of hypertension that is currently well-managed with chlorthalidone 25 mg and losartan 50 mg daily.  Tolerates both medications well without side effects.  Not checking blood pressure at home.  Physically active as noted above.  Tries to limit dietary sodium whenever possible. Denies CP, SOB, palpitations, lower extremity edema, dizziness, headaches, or vision changes.  Blood pressure at goal today.  Checking labs as below.  Continue chlorthalidone and losartan as prescribed. - CBC with  Differential/Platelet - CMP14+EGFR - Lipid panel  3. Chronic obstructive pulmonary disease, unspecified COPD type (HCC) History of COPD but is only on albuterol inhaler as needed.  Denies any respiratory changes or concerns.  Of note, she does report that she was diagnosed having sleep apnea years ago but when they tried to use a CPAP, it worsened her respiratory status and caused frequent wheezing and coughing.  Does not use a CPAP at this time due to this.  4. Prediabetes History of prediabetes with last hemoglobin A1c checked - POCT HgB A1C on 05/25/2023 with a result of 5.8%.  Recheck today at 5.4% which shows good improvement.  Continue working on diet, exercise, and weight loss.  5. Mixed hyperlipidemia History of hyperlipidemia currently managed with atorvastatin 20 mg daily.  Tolerating the medication well without side effects.  Working on dietary habits as noted above.  Checking lipids today.  Continue atorvastatin. - atorvastatin (LIPITOR) 20 MG tablet; Take 1 tablet (20 mg total) by mouth daily.  Dispense: 90 tablet; Refill: 3  6. Chronic pain of left knee She does have a chronic history of left knee pain.  She was started on meloxicam by Dr. Benjamin Stain which has been working very well.  She takes 15 mg daily and has no concerning side effects.  Renal function historically normal.  Rechecking today.  Continue meloxicam as prescribed. - meloxicam (MOBIC) 15 MG tablet; Take 1 tablet (15 mg total) by mouth daily.  Dispense: 90 tablet; Refill: 3   Procedures performed this visit: None.  Return in about 6  months (around 05/18/2024) for chronic disease follow up.  __________________________________ Thayer Ohm, DNP, APRN, FNP-BC Primary Care and Sports Medicine [facility]

## 2023-11-16 NOTE — Telephone Encounter (Signed)
 Provider - please advise regarding the [facility] rx, thanks.   E2C2 FRT - location change for mammo screening.  Copied from CRM [id number]. Topic: Appointments - Appointment Info/Confirmation >> Nov 16, 2023 12:14 PM Shelah Lewandowsky wrote:  Patient does not want to do the cancer screening in [place], would like a closer location- also WEGOVY 0.25 MG/0.5ML SOAJ is too expensive and need something else- [phone]

## 2023-11-17 ENCOUNTER — Other Ambulatory Visit (HOSPITAL_COMMUNITY): Payer: Self-pay

## 2023-11-17 ENCOUNTER — Telehealth: Payer: Self-pay

## 2023-11-17 ENCOUNTER — Encounter: Payer: Self-pay | Admitting: Medical-Surgical

## 2023-11-17 DIAGNOSIS — E66812 Obesity, class 2: Secondary | ICD-10-CM

## 2023-11-17 DIAGNOSIS — R7303 Prediabetes: Secondary | ICD-10-CM

## 2023-11-17 DIAGNOSIS — I10 Essential (primary) hypertension: Secondary | ICD-10-CM

## 2023-11-17 LAB — CBC WITH DIFFERENTIAL/PLATELET
Basophils Absolute: 0.1 10*3/uL (ref 0.0–0.2)
Basos: 1 %
EOS (ABSOLUTE): 0.4 10*3/uL (ref 0.0–0.4)
Eos: 5 %
Hematocrit: 38.1 % (ref 34.0–46.6)
Hemoglobin: 12.6 g/dL (ref 11.1–15.9)
Immature Grans (Abs): 0 10*3/uL (ref 0.0–0.1)
Immature Granulocytes: 0 %
Lymphocytes Absolute: 2 10*3/uL (ref 0.7–3.1)
Lymphs: 32 %
MCH: 31 pg (ref 26.6–33.0)
MCHC: 33.1 g/dL (ref 31.5–35.7)
MCV: 94 fL (ref 79–97)
Monocytes Absolute: 0.6 10*3/uL (ref 0.1–0.9)
Monocytes: 10 %
Neutrophils Absolute: 3.4 10*3/uL (ref 1.4–7.0)
Neutrophils: 52 %
Platelets: 251 10*3/uL (ref 150–450)
RBC: 4.07 x10E6/uL (ref 3.77–5.28)
RDW: 13.1 % (ref 11.7–15.4)
WBC: 6.5 10*3/uL (ref 3.4–10.8)

## 2023-11-17 LAB — CMP14+EGFR
ALT: 13 IU/L (ref 0–32)
AST: 17 IU/L (ref 0–40)
Albumin: 4.3 g/dL (ref 3.9–4.9)
Alkaline Phosphatase: 64 IU/L (ref 44–121)
BUN/Creatinine Ratio: 26 (ref 12–28)
BUN: 28 mg/dL — ABNORMAL HIGH (ref 8–27)
Bilirubin Total: 0.4 mg/dL (ref 0.0–1.2)
CO2: 25 mmol/L (ref 20–29)
Calcium: 9.8 mg/dL (ref 8.7–10.3)
Chloride: 102 mmol/L (ref 96–106)
Creatinine, Ser: 1.06 mg/dL — ABNORMAL HIGH (ref 0.57–1.00)
Globulin, Total: 2.6 g/dL (ref 1.5–4.5)
Glucose: 103 mg/dL — ABNORMAL HIGH (ref 70–99)
Potassium: 3.6 mmol/L (ref 3.5–5.2)
Sodium: 140 mmol/L (ref 134–144)
Total Protein: 6.9 g/dL (ref 6.0–8.5)
eGFR: 59 mL/min/{1.73_m2} — ABNORMAL LOW (ref >59)

## 2023-11-17 LAB — LIPID PANEL
Chol/HDL Ratio: 3.1 ratio (ref 0.0–4.4)
Cholesterol, Total: 165 mg/dL (ref 100–199)
HDL: 54 mg/dL (ref >39)
LDL Chol Calc (NIH): 93 mg/dL (ref 0–99)
Triglycerides: 98 mg/dL (ref 0–149)
VLDL Cholesterol Cal: 18 mg/dL (ref 5–40)

## 2023-11-17 MED ORDER — PHENTERMINE-TOPIRAMATE ER 3.75-23 MG PO CP24: 1.0000 | ORAL_CAPSULE | Freq: Every morning | ORAL | 0 refills | Status: DC

## 2023-11-17 NOTE — Telephone Encounter (Signed)
 Contacted the patient regarding the [facility] rx. Per patient, she cannot afford the rx. The OOP cost is too high. Patient is requesting a new rx for Qysmia. Pls advise, Thanks.

## 2023-11-17 NOTE — Telephone Encounter (Signed)
 Pharmacy Patient Advocate Encounter   Received notification from Pt Calls Messages that prior authorization for Wegovy 0.25 is required/requested.   Insurance verification completed.   The patient is insured through [facility] ADVANTAGE/RX ADVANCE .   Per test claim: Refill too soon. PA is not needed at this time. Medication was filled 11/16/23. Next eligible fill date is 12/07/23.

## 2023-11-17 NOTE — Telephone Encounter (Signed)
 PA request for Wegovy 0.25 mg. Thanks in advance.

## 2023-11-17 NOTE — Telephone Encounter (Signed)
 Task completed. Patient has been updated of the outcome via MyChart msg.

## 2023-11-17 NOTE — Addendum Note (Signed)
 Addended byChristen Butter on: 11/17/2023 08:37 PM   Modules accepted: Orders

## 2023-11-17 NOTE — Telephone Encounter (Signed)
 Did she pick up the medication after all?

## 2023-11-17 NOTE — Addendum Note (Signed)
 Addended byChristen Butter on: 11/17/2023 05:39 PM   Modules accepted: Orders

## 2023-11-18 ENCOUNTER — Telehealth: Payer: Self-pay

## 2023-11-18 ENCOUNTER — Other Ambulatory Visit (HOSPITAL_COMMUNITY): Payer: Self-pay

## 2023-11-18 NOTE — Telephone Encounter (Signed)
 Please review the previous message. Thanks in advance.

## 2023-11-18 NOTE — Telephone Encounter (Signed)
 Alternative rx sent to the pharmacy. PA for Qysmia required. Thanks in advance.

## 2023-11-18 NOTE — Telephone Encounter (Signed)
 PA request has been Submitted. New Encounter has been or will be created for follow up. For additional info see Pharmacy Prior Auth telephone encounter from 11/18/23.

## 2023-11-18 NOTE — Telephone Encounter (Signed)
 Pharmacy Patient Advocate Encounter   Received notification from Pt Calls Messages that prior authorization for Qsymia is required/requested.   Insurance verification completed.   The patient is insured through [facility] ADVANTAGE/RX ADVANCE .   Per test claim: PA required; PA submitted to above mentioned insurance via CoverMyMeds Key/confirmation #/EOC [id number] Status is pending

## 2023-11-22 ENCOUNTER — Other Ambulatory Visit (HOSPITAL_COMMUNITY): Payer: Self-pay

## 2023-11-22 NOTE — Telephone Encounter (Signed)
 Pharmacy Patient Advocate Encounter  Received notification from [facility] ADVANTAGE/RX ADVANCE that Prior Authorization for Qsymia has been APPROVED from 11/18/23 to 11/17/24. Ran test claim, Copay is $30.00. This test claim was processed through [facility]- copay amounts may vary at other pharmacies due to pharmacy/plan contracts, or as the patient moves through the different stages of their insurance plan.   PA #/Case ID/Reference #: [id number]

## 2023-11-29 MED ORDER — ZEPBOUND 2.5 MG/0.5ML ~~LOC~~ SOAJ: 2.5000 mg | SUBCUTANEOUS | 0 refills | Status: DC

## 2023-11-30 MED ORDER — PHENTERMINE-TOPIRAMATE ER 7.5-46 MG PO CP24: 1.0000 | ORAL_CAPSULE | Freq: Every day | ORAL | 2 refills | Status: AC

## 2023-11-30 NOTE — Addendum Note (Signed)
 Addended by: Latanya Presser on: 11/30/2023 09:10 AM   Modules accepted: Orders

## 2023-11-30 NOTE — Addendum Note (Signed)
 Addended byChristen Butter on: 11/30/2023 11:39 AM   Modules accepted: Orders

## 2023-12-01 ENCOUNTER — Encounter: Payer: Self-pay | Admitting: Medical-Surgical

## 2023-12-01 LAB — BMP8+EGFR
BUN/Creatinine Ratio: 18 (ref 12–28)
BUN: 19 mg/dL (ref 8–27)
CO2: 23 mmol/L (ref 20–29)
Calcium: 9.6 mg/dL (ref 8.7–10.3)
Chloride: 103 mmol/L (ref 96–106)
Creatinine, Ser: 1.04 mg/dL — ABNORMAL HIGH (ref 0.57–1.00)
Glucose: 93 mg/dL (ref 70–99)
Potassium: 4 mmol/L (ref 3.5–5.2)
Sodium: 140 mmol/L (ref 134–144)
eGFR: 60 mL/min/{1.73_m2} (ref >59)

## 2023-12-01 NOTE — Telephone Encounter (Signed)
 New mychart sent by pt:  Amedeo [facility] (supporting Joy Jessup, NP)3 minutes ago (3:42 PM)   [place] Is there meds for a virus. Diarrea & votmiting coming out both ins since this around 330 am. Horald Pollen had been sick now i have it my friend Tammy. Im drinking ginger ale.       Joy, please advise on this for pt if you have any recommendations.

## 2023-12-02 NOTE — Telephone Encounter (Signed)
 The note has been printed, signed, and placed at the front desk for her to pick up.

## 2023-12-15 ENCOUNTER — Ambulatory Visit

## 2023-12-15 DIAGNOSIS — Z122 Encounter for screening for malignant neoplasm of respiratory organs: Secondary | ICD-10-CM

## 2023-12-15 DIAGNOSIS — Z87891 Personal history of nicotine dependence: Secondary | ICD-10-CM

## 2023-12-15 DIAGNOSIS — Z1231 Encounter for screening mammogram for malignant neoplasm of breast: Secondary | ICD-10-CM

## 2023-12-15 MED ORDER — IOHEXOL 300 MG/ML  SOLN: 100.0000 mL | Freq: Once | INTRAMUSCULAR | Status: DC | PRN

## 2023-12-19 ENCOUNTER — Encounter: Payer: Self-pay | Admitting: Medical-Surgical

## 2024-01-04 NOTE — Telephone Encounter (Signed)
 Coupon printed out for patient I will bring it by on lunch for her to pick up, this afternoon. Denece Finger, CMA

## 2024-01-09 ENCOUNTER — Ambulatory Visit: Payer: Self-pay | Admitting: Medical-Surgical

## 2024-01-09 DIAGNOSIS — Z1211 Encounter for screening for malignant neoplasm of colon: Secondary | ICD-10-CM

## 2024-01-16 ENCOUNTER — Other Ambulatory Visit: Payer: Self-pay | Admitting: Medical-Surgical

## 2024-01-17 NOTE — Telephone Encounter (Signed)
 Patient requesting rx rf of estradiol  0.5mg   Last written 10/17/2023 Last OV 11/16/2023 Upcoming appt 03/12/2024

## 2024-01-18 ENCOUNTER — Encounter: Payer: Self-pay | Admitting: Medical-Surgical

## 2024-01-19 NOTE — Telephone Encounter (Signed)
 Please see patient message .  Requesting order for colonoscopy to somewhere in [place] . And requesting to scheduled for 03/09/24.  Pended referral for colonoscopy.

## 2024-01-31 NOTE — Telephone Encounter (Signed)
 Can you help with this? I'm not sure about the Medicaid question or even what type of Medicaid she may have.

## 2024-03-12 ENCOUNTER — Ambulatory Visit (INDEPENDENT_AMBULATORY_CARE_PROVIDER_SITE_OTHER): Admitting: Medical-Surgical

## 2024-03-12 ENCOUNTER — Encounter: Payer: Self-pay | Admitting: Medical-Surgical

## 2024-03-12 VITALS — BP 134/67 | HR 60 | Resp 20 | Ht 64.0 in | Wt 209.0 lb

## 2024-03-12 DIAGNOSIS — E661 Drug-induced obesity: Secondary | ICD-10-CM

## 2024-03-12 DIAGNOSIS — I83819 Varicose veins of unspecified lower extremities with pain: Secondary | ICD-10-CM | POA: Diagnosis not present

## 2024-03-12 DIAGNOSIS — E66812 Obesity, class 2: Secondary | ICD-10-CM | POA: Diagnosis not present

## 2024-03-12 DIAGNOSIS — Z6836 Body mass index (BMI) 36.0-36.9, adult: Secondary | ICD-10-CM

## 2024-03-12 NOTE — Patient Instructions (Addendum)
 Located in: [facility] Address: [address], [place], [place] [address] Phone: [phone]  Low-FODMAP Eating Plan  FODMAP stands for fermentable oligosaccharides, disaccharides, monosaccharides, and polyols. These are sugars that are hard for some people to digest. A low-FODMAP eating plan may help some people who have irritable bowel syndrome (IBS) and certain other bowel (intestinal) diseases to manage their symptoms. This meal plan can be complicated to follow. Work with a diet and nutrition specialist (dietitian) to make a low-FODMAP eating plan that is right for you. A dietitian can help make sure that you get enough nutrition from this diet. What are tips for following this plan? Reading food labels Check labels for hidden FODMAPs such as: High-fructose syrup. Honey. Agave. Natural fruit flavors. Onion or garlic powder. Choose low-FODMAP foods that contain 3-4 grams of fiber per serving. Check food labels for serving sizes. Eat only one serving at a time to make sure FODMAP levels stay low. Shopping Shop with a list of foods that are recommended on this diet and make a meal plan. Meal planning Follow a low-FODMAP eating plan for up to 6 weeks, or as told by your health care provider or dietitian. To follow the eating plan: Eliminate high-FODMAP foods from your diet completely. Choose only low-FODMAP foods to eat. You will do this for 2-6 weeks. Gradually reintroduce high-FODMAP foods into your diet one at a time. Most people should wait a few days before introducing the next new high-FODMAP food into their meal plan. Your dietitian can recommend how quickly you may reintroduce foods. Keep a daily record of what and how much you eat and drink. Make note of any symptoms that you have after eating. Review your daily record with a dietitian regularly to identify which foods you can eat and which foods you should avoid. General  tips Drink enough fluid each day to keep your urine pale yellow. Avoid processed foods. These often have added sugar and may be high in FODMAPs. Avoid most dairy products, whole grains, and sweeteners. Work with a dietitian to make sure you get enough fiber in your diet. Avoid high FODMAP foods at meals to manage symptoms. Recommended foods Fruits Bananas, oranges, tangerines, lemons, limes, blueberries, raspberries, strawberries, grapes, cantaloupe, honeydew melon, kiwi, papaya, passion fruit, and pineapple. Limited amounts of dried cranberries, banana chips, and shredded coconut. Vegetables Eggplant, zucchini, cucumber, peppers, green beans, bean sprouts, lettuce, arugula, kale, Swiss chard, spinach, collard greens, bok choy, summer squash, potato, and tomato. Limited amounts of corn, carrot, and sweet potato. Green parts of scallions. Grains Gluten-free grains, such as rice, oats, buckwheat, quinoa, corn, polenta, and millet. Gluten-free pasta, bread, or cereal. Rice noodles. Corn tortillas. Meats and other proteins Unseasoned beef, pork, poultry, or fish. Eggs. [place]. Tofu (firm) and tempeh. Limited amounts of nuts and seeds, such as almonds, walnuts, estonia nuts, pecans, peanuts, nut butters, pumpkin seeds, chia seeds, and sunflower seeds. Dairy Lactose-free milk, yogurt, and kefir. Lactose-free cottage cheese and ice cream. Non-dairy milks, such as almond, coconut, hemp, and rice milk. Non-dairy yogurt. Limited amounts of goat cheese, brie, mozzarella, parmesan, swiss, and other hard cheeses. Fats and oils Butter-free spreads. Vegetable oils, such as olive, canola, and sunflower oil. Seasoning and other foods Artificial sweeteners with names that do not end in ol, such as aspartame, saccharine, and stevia. Maple syrup, white table sugar, raw sugar, brown sugar, and molasses. Mayonnaise, soy sauce, and tamari. Fresh basil, coriander, parsley, rosemary, and thyme. Beverages Water  and  mineral water. Sugar-sweetened soft drinks. Small amounts of orange juice or cranberry juice. Black and green tea. Most dry wines. Coffee. The items listed above may not be a complete list of foods and beverages you can eat. Contact a dietitian for more information. Foods to avoid Fruits Fresh, dried, and juiced forms of apple, pear, watermelon, peach, plum, cherries, apricots, blackberries, boysenberries, figs, nectarines, and mango. Avocado. Vegetables Chicory root, artichoke, asparagus, cabbage, snow peas, Brussels sprouts, broccoli, sugar snap peas, mushrooms, celery, and cauliflower. Onions, garlic, leeks, and the white part of scallions. Grains Wheat, including kamut, durum, and semolina. Barley and bulgur. Couscous. Wheat-based cereals. Wheat noodles, bread, crackers, and pastries. Meats and other proteins Fried or fatty meat. Sausage. Cashews and pistachios. Soybeans, baked beans, black beans, chickpeas, kidney beans, fava beans, navy beans, lentils, black-eyed peas, and split peas. Dairy Milk, yogurt, ice cream, and soft cheese. Cream and sour cream. Milk-based sauces. Custard. Buttermilk. Soy milk. Seasoning and other foods Any sugar-free gum or candy. Foods that contain artificial sweeteners such as sorbitol, mannitol, isomalt, or xylitol. Foods that contain honey, high-fructose corn syrup, or agave. Bouillon, vegetable stock, beef stock, and chicken stock. Garlic and onion powder. Condiments made with onion, such as hummus, chutney, pickles, relish, salad dressing, and salsa. Tomato paste. Beverages Chicory-based drinks. Coffee substitutes. Chamomile tea. Fennel tea. Sweet or fortified wines such as port or sherry. Diet soft drinks made with isomalt, mannitol, maltitol, sorbitol, or xylitol. Apple, pear, and mango juice. Juices with high-fructose corn syrup. The items listed above may not be a complete list of foods and beverages you should avoid. Contact a dietitian for more  information. Summary FODMAP stands for fermentable oligosaccharides, disaccharides, monosaccharides, and polyols. These are sugars that are hard for some people to digest. A low-FODMAP eating plan is a short-term diet that helps to ease symptoms of certain bowel diseases. The eating plan usually lasts up to 6 weeks. After that, high-FODMAP foods are reintroduced gradually and one at a time. This can help you find out which foods may be causing symptoms. A low-FODMAP eating plan can be complicated. It is best to work with a dietitian who has experience with this type of plan. This information is not intended to replace advice given to you by your health care provider. Make sure you discuss any questions you have with your health care provider. Document Revised: 07/24/2023 Document Reviewed: 07/24/2023 Elsevier Patient Education  2025 [employer].

## 2024-03-12 NOTE — Progress Notes (Signed)
        Established patient visit  Discussed the use of AI scribe software for clinical note transcription with the patient, who gave verbal consent to proceed.  History of Present Illness   Annette Ayala is a 63 year old female who presents with excessive gassiness and left hip pain. She is accompanied by her granddaughter.  Abdominal bloating and flatulence - Excessive gassiness after eating, no matter what she eats - Occasional use of Linzess  for constipation - Constipation is infrequent  Left hip pain - Pain began after a fall on her left side approximately one month ago - Pain has progressively worsened over time - Pain is primarily located on the left side of the hip - Pain is most pronounced when lying on her right side at night - Pain is absent unless lying on the affected side, but can occasionally be felt when walking   Right lower leg lump - Soft lump on the posterior right lower leg - Getting bigger and is more bothersome - Hx of varicose veins   Weight concerns - Previously took Qsymia  with good results but was not able to finish the full course due to insurance - Recent trial to restart Qsymia  unsuccessful      Physical Exam Vitals reviewed.  Constitutional:      General: She is not in acute distress.    Appearance: Normal appearance. She is obese. She is not ill-appearing.  HENT:     Head: Normocephalic and atraumatic.  Cardiovascular:     Rate and Rhythm: Normal rate and regular rhythm.  Pulmonary:     Effort: Pulmonary effort is normal. No respiratory distress.     Breath sounds: Normal breath sounds. No wheezing.  Skin:    General: Skin is warm and dry.  Neurological:     Mental Status: She is alert and oriented to person, place, and time.  Psychiatric:        Mood and Affect: Mood normal.        Behavior: Behavior normal.        Thought Content: Thought content normal.        Judgment: Judgment normal.     Assessment and Plan     Excessive Flatulence Excessive gassiness postprandially without known cause. Simethicone considered for relief, Bentyl as alternative if needed. - Provide information on high FODMAP foods. - Recommend simethicone (Gas-X). - Consider Bentyl if simethicone is ineffective.  Trochanteric Bursitis Left hip pain consistent with trochanteric bursitis post-fall. Pain likely from bursal inflammation. - Provide exercises for trochanteric bursitis. - Recommend using ice or heat for pain relief. - Consider anti-inflammatory medications if needed.  Vein Malformation Right lower leg spot suggestive of vein malformation. Referral to specialist discussed for evaluation and treatment options. - Refer to vein and vascular specialist for evaluation.  Weight Management Interested in weight management; insurance issues currently impede treatment. Agreed to reassess once coverage is clarified. - Reassess weight management options once insurance coverage is clarified.      Return in about 6 months (around 09/12/2024) for chronic disease follow up.  __________________________________ Zada FREDRIK Palin, DNP, APRN, FNP-BC Primary Care and Sports Medicine [facility] [place]

## 2024-03-14 ENCOUNTER — Encounter: Payer: Self-pay | Admitting: Medical-Surgical

## 2024-03-15 ENCOUNTER — Other Ambulatory Visit: Payer: Self-pay

## 2024-03-15 DIAGNOSIS — R300 Dysuria: Secondary | ICD-10-CM

## 2024-03-15 NOTE — Progress Notes (Signed)
Ordered labs per [employer].

## 2024-03-15 NOTE — Telephone Encounter (Signed)
 Annette Ayala was unable to come during nurse visit openings. Ordered labs per request. Patient will be here today around 11am.

## 2024-03-16 ENCOUNTER — Ambulatory Visit: Payer: Self-pay | Admitting: Medical-Surgical

## 2024-03-16 LAB — URINALYSIS, ROUTINE W REFLEX MICROSCOPIC
Bilirubin, UA: NEGATIVE
Glucose, UA: NEGATIVE
Ketones, UA: NEGATIVE
Nitrite, UA: NEGATIVE
Protein,UA: NEGATIVE
RBC, UA: NEGATIVE
Specific Gravity, UA: 1.016 (ref 1.005–1.030)
Urobilinogen, Ur: 0.2 mg/dL (ref 0.2–1.0)
pH, UA: 7.5 (ref 5.0–7.5)

## 2024-03-16 LAB — MICROSCOPIC EXAMINATION
Bacteria, UA: NONE SEEN
Casts: NONE SEEN /LPF

## 2024-03-19 ENCOUNTER — Encounter: Payer: Self-pay | Admitting: Medical-Surgical

## 2024-03-19 NOTE — Telephone Encounter (Signed)
 Please see message sent by pt about meloxicam .

## 2024-04-06 LAB — HM COLONOSCOPY

## 2024-04-16 ENCOUNTER — Other Ambulatory Visit: Payer: Self-pay | Admitting: Medical-Surgical

## 2024-04-24 ENCOUNTER — Encounter: Payer: Self-pay | Admitting: Sports Medicine

## 2024-05-17 ENCOUNTER — Ambulatory Visit: Admitting: Medical-Surgical

## 2024-06-08 ENCOUNTER — Encounter: Payer: Self-pay | Admitting: Medical-Surgical

## 2024-06-08 ENCOUNTER — Ambulatory Visit: Admitting: Medical-Surgical

## 2024-06-08 VITALS — BP 130/59 | HR 61 | Ht 64.0 in | Wt 212.0 lb

## 2024-06-08 DIAGNOSIS — J449 Chronic obstructive pulmonary disease, unspecified: Secondary | ICD-10-CM | POA: Diagnosis not present

## 2024-06-08 DIAGNOSIS — Z23 Encounter for immunization: Secondary | ICD-10-CM | POA: Diagnosis not present

## 2024-06-08 DIAGNOSIS — I10 Essential (primary) hypertension: Secondary | ICD-10-CM

## 2024-06-08 DIAGNOSIS — G8929 Other chronic pain: Secondary | ICD-10-CM

## 2024-06-08 DIAGNOSIS — M25562 Pain in left knee: Secondary | ICD-10-CM

## 2024-06-08 DIAGNOSIS — M79672 Pain in left foot: Secondary | ICD-10-CM

## 2024-06-08 DIAGNOSIS — F419 Anxiety disorder, unspecified: Secondary | ICD-10-CM

## 2024-06-08 DIAGNOSIS — E669 Obesity, unspecified: Secondary | ICD-10-CM | POA: Diagnosis not present

## 2024-06-08 NOTE — Progress Notes (Signed)
 Established Patient Office Visit  Subjective   Patient ID: Annette Ayala, female    DOB: 01/03/1961  Age: 63 y.o. MRN: [id number]  Chief Complaint  Patient presents with   Follow-up   Weight Loss   Pain    Mercer of left foot    HPI  63 year old female presents to the office for a 6 month follow up for the following chronic medical conditions:  COPD: Patient states that she is doing well on her Ventolin  Inhaler and uses PRN. Patient denies having any increased shortness of breath.  Essential Hypertension: Patient stable on Losartan  50 mg and Chlorthiladone 25 mg daily. Patient does not monitor blood pressures at home. She denies having any chest pain, increased shortness of breath, dizziness, heart palpitations, or lower extremity edema.  Chronic Knee Pain: Patient is currently being treated with meloxicam  15 mg daily. She states that she is not getting much relief with this medication. Patient requests for an updated work note.  Obesity: Patient states that she was taking the Wellbutrin  300 mg daily to help with weight loss. Patient states that she has not seen any results. She also states that her insurance will not cover the phentermine -topiramate . She would like to discuss other options for weight loss.  Left Foot Pain: Patient states that she has been having pain on the ball of her left foot x 2 weeks. Patient states that pain is worse when she stands, but improves when she sits down. Rates pain as a 8/10. She has tried taking extra strength tylenol with no relief.   Anxiety: Patient states that she has increased anxiety due to raising her great- grand daughter. GAD 7 today is an 8. Will continue current treatment regimen of Buspar  5 mg daily.  Review of Systems  Constitutional: Negative.   HENT: Negative.    Eyes: Negative.   Respiratory: Negative.    Cardiovascular: Negative.   Gastrointestinal: Negative.   Genitourinary: Negative.   Musculoskeletal:        Left  foot pain  Skin: Negative.   Neurological: Negative.   Endo/Heme/Allergies: Negative.   Psychiatric/Behavioral:  The patient is nervous/anxious.       Objective:     BP (!) 130/59 (BP Location: Right Arm, Patient Position: Sitting, Cuff Size: Normal)   Pulse 61   Ht 5' 4 (1.626 m)   Wt 96.2 kg   SpO2 96%   BMI 36.39 kg/m  BP Readings from Last 3 Encounters:  06/08/24 (!) 130/59  03/12/24 134/67  11/16/23 122/66   Wt Readings from Last 3 Encounters:  06/08/24 96.2 kg  03/12/24 94.8 kg  12/15/23 96.2 kg      Physical Exam Vitals and nursing note reviewed.  Constitutional:      General: She is not in acute distress.    Appearance: Normal appearance. She is obese.  HENT:     Head: Normocephalic.  Cardiovascular:     Rate and Rhythm: Normal rate and regular rhythm.     Pulses: Normal pulses.     Heart sounds: Normal heart sounds.  Pulmonary:     Effort: Pulmonary effort is normal.     Breath sounds: Normal breath sounds.  Neurological:     General: No focal deficit present.     Mental Status: She is alert and oriented to person, place, and time.  Psychiatric:        Mood and Affect: Mood normal.        Behavior: Behavior normal.  Thought Content: Thought content normal.        Judgment: Judgment normal.      No results found for any visits on 06/08/24.  Last lipids Lab Results  Component Value Date   CHOL 165 11/16/2023   HDL 54 11/16/2023   LDLCALC 93 11/16/2023   TRIG 98 11/16/2023   CHOLHDL 3.1 11/16/2023   Last hemoglobin A1c Lab Results  Component Value Date   HGBA1C 5.4 11/16/2023      The 10-year ASCVD risk score (Arnett DK, et al., 2019) is: 7.2%    Assessment & Plan:   1. Benign essential hypertension (Primary) -Patient stable on Losartan  50 mg and Chlorthiladone 25 mg daily.  -Continue with current therapy  2. Obesity (BMI 35.0-39.9 without comorbidity) -Patient reports no improvement with Wellbutrin  300 mg  daily. -Insurance will not cover the phentermine -topiramate . -Discussed other weight loss options with patient, but patient are not agreeable to other options at this time -Will re-evaluate at a later time  3. Chronic obstructive pulmonary disease, unspecified COPD type (HCC) - Stable on her Ventolin  Inhaler and uses PRN.  -Patient denies having any increased shortness of breath.  4. Chronic pain of left knee -Treated with Meloxicam  15 mg with no relief -Request updated FMLA paper  5. Left foot pain -Offered imaging, but declined  6. Anxiety -GAD 7 today is an 8.  -Will continue current treatment regimen of Buspar  5 mg daily.  7. Immunization due - Flu vaccine trivalent PF, 6mos and older(Flulaval,Afluria,Fluarix,Fluzone) - Pfizer Comirnaty Covid -19 Vaccine 21yrs and older    Return in about 6 months (around 12/07/2024) for chronic disease follow up.    Derrek JINNY Freund, NP Student

## 2024-06-08 NOTE — Progress Notes (Signed)
 Left plantar foot pain at the distal arch suspicious for plantar fasciitis.  Continue meloxicam  15 mg daily.  Discussed conservative measures with icing, stretches, massage, and supportive shoes.  Offered referral to sports medicine or podiatry to consider further investigation and possible custom orthotics, declined today.  Medical screening examination/treatment was performed by qualified clinical staff member and as supervising provider I was immediately available for consultation/collaboration. I have reviewed documentation and agree with assessment and plan.  Zada FREDRIK Palin, DNP, APRN, FNP-BC [facility]

## 2024-06-12 ENCOUNTER — Telehealth: Payer: Self-pay

## 2024-06-12 NOTE — Telephone Encounter (Signed)
 Patient came by office to get drop off FMLA Paperwork, informed patient that office will inform her when form is complete, and that we will fax and make copies, forms placed in Joy's box, thanks.

## 2024-06-13 NOTE — Telephone Encounter (Signed)
 Contacted patient to let her know that we can't fax her forms until she signs them and her supervisor needs to sign the form as well. The patient stated that she will come by today to pick up the forms.

## 2024-06-18 NOTE — Telephone Encounter (Signed)
 Patient came into office to drop off Documentation of Disability Form to be completed by Zada, informed patient that we will contact her when forms are complete and forms will be faxed if there is a fax number, thanks. Forms placed in Joy's box.

## 2024-06-19 ENCOUNTER — Encounter: Payer: Self-pay | Admitting: Medical-Surgical

## 2024-09-12 ENCOUNTER — Ambulatory Visit: Admitting: Medical-Surgical

## 2024-09-17 ENCOUNTER — Other Ambulatory Visit: Payer: Self-pay | Admitting: Medical-Surgical

## 2024-09-22 ENCOUNTER — Other Ambulatory Visit: Payer: Self-pay | Admitting: Medical-Surgical

## 2024-12-07 ENCOUNTER — Ambulatory Visit: Admitting: Medical-Surgical
